# Patient Record
Sex: Male | Born: 1970
Health system: Southern US, Community
[De-identification: ages and names within clinical notes are randomized; demographics above are authoritative.]

## PROBLEM LIST (undated history)

## (undated) DIAGNOSIS — F319 Bipolar disorder, unspecified: Secondary | ICD-10-CM

## (undated) DIAGNOSIS — F32A Depression, unspecified: Secondary | ICD-10-CM

## (undated) DIAGNOSIS — F329 Major depressive disorder, single episode, unspecified: Secondary | ICD-10-CM

## (undated) DIAGNOSIS — M5430 Sciatica, unspecified side: Secondary | ICD-10-CM

## (undated) DIAGNOSIS — T7840XA Allergy, unspecified, initial encounter: Secondary | ICD-10-CM

## (undated) DIAGNOSIS — B009 Herpesviral infection, unspecified: Secondary | ICD-10-CM

## (undated) DIAGNOSIS — F419 Anxiety disorder, unspecified: Secondary | ICD-10-CM

## (undated) DIAGNOSIS — G8929 Other chronic pain: Secondary | ICD-10-CM

## (undated) DIAGNOSIS — R55 Syncope and collapse: Secondary | ICD-10-CM

## (undated) HISTORY — DX: Anxiety disorder, unspecified: F41.9

## (undated) HISTORY — DX: Major depressive disorder, single episode, unspecified: F32.9

## (undated) HISTORY — DX: Allergy, unspecified, initial encounter: T78.40XA

## (undated) HISTORY — PX: KNEE SURGERY: SHX244

## (undated) HISTORY — PX: HERNIA REPAIR: SHX51

## (undated) HISTORY — DX: Bipolar disorder, unspecified: F31.9

## (undated) HISTORY — DX: Depression, unspecified: F32.A

---

## 2006-06-28 ENCOUNTER — Emergency Department (HOSPITAL_COMMUNITY): Admission: EM | Admit: 2006-06-28 | Discharge: 2006-06-28 | Payer: Self-pay | Admitting: Emergency Medicine

## 2007-06-24 ENCOUNTER — Emergency Department (HOSPITAL_COMMUNITY): Admission: EM | Admit: 2007-06-24 | Discharge: 2007-06-24 | Payer: Self-pay | Admitting: Emergency Medicine

## 2008-02-04 ENCOUNTER — Emergency Department (HOSPITAL_COMMUNITY): Admission: EM | Admit: 2008-02-04 | Discharge: 2008-02-04 | Payer: Self-pay | Admitting: Emergency Medicine

## 2008-09-22 ENCOUNTER — Emergency Department (HOSPITAL_COMMUNITY): Admission: EM | Admit: 2008-09-22 | Discharge: 2008-09-22 | Payer: Self-pay | Admitting: Emergency Medicine

## 2010-07-20 ENCOUNTER — Emergency Department (HOSPITAL_COMMUNITY)
Admission: EM | Admit: 2010-07-20 | Discharge: 2010-07-20 | Disposition: A | Discharge status: Home / Self Care | Payer: Self-pay | Attending: Emergency Medicine | Admitting: Emergency Medicine

## 2010-07-20 ENCOUNTER — Emergency Department (HOSPITAL_COMMUNITY): Payer: Self-pay

## 2010-07-20 DIAGNOSIS — M545 Low back pain, unspecified: Secondary | ICD-10-CM | POA: Insufficient documentation

## 2010-07-20 DIAGNOSIS — M25579 Pain in unspecified ankle and joints of unspecified foot: Secondary | ICD-10-CM | POA: Insufficient documentation

## 2010-07-20 DIAGNOSIS — Y92009 Unspecified place in unspecified non-institutional (private) residence as the place of occurrence of the external cause: Secondary | ICD-10-CM | POA: Insufficient documentation

## 2010-07-20 DIAGNOSIS — W108XXA Fall (on) (from) other stairs and steps, initial encounter: Secondary | ICD-10-CM | POA: Insufficient documentation

## 2010-07-20 DIAGNOSIS — R110 Nausea: Secondary | ICD-10-CM | POA: Insufficient documentation

## 2010-07-20 DIAGNOSIS — M25519 Pain in unspecified shoulder: Secondary | ICD-10-CM | POA: Insufficient documentation

## 2010-07-20 DIAGNOSIS — M542 Cervicalgia: Secondary | ICD-10-CM | POA: Insufficient documentation

## 2010-07-20 DIAGNOSIS — K219 Gastro-esophageal reflux disease without esophagitis: Secondary | ICD-10-CM | POA: Insufficient documentation

## 2010-07-20 DIAGNOSIS — R51 Headache: Secondary | ICD-10-CM | POA: Insufficient documentation

## 2011-01-01 LAB — BASIC METABOLIC PANEL
Calcium: 9.6
GFR calc Af Amer: >60
GFR calc non Af Amer: 54 — ABNORMAL LOW
Sodium: 138

## 2011-01-01 LAB — CBC
Hemoglobin: 14.7
RBC: 4.53
WBC: 4.9

## 2011-01-01 LAB — URINALYSIS, ROUTINE W REFLEX MICROSCOPIC
Glucose, UA: NEGATIVE
Leukocytes, UA: NEGATIVE
Specific Gravity, Urine: >1.03 — ABNORMAL HIGH
Urobilinogen, UA: 0.2

## 2011-01-01 LAB — DIFFERENTIAL
Lymphocytes Relative: 30
Monocytes Absolute: 0.5
Monocytes Relative: 9
Neutro Abs: 2.7

## 2011-01-01 LAB — URINE MICROSCOPIC-ADD ON

## 2011-05-30 ENCOUNTER — Emergency Department (HOSPITAL_COMMUNITY): Payer: Self-pay

## 2011-05-30 ENCOUNTER — Encounter (HOSPITAL_COMMUNITY): Payer: Self-pay | Admitting: *Deleted

## 2011-05-30 ENCOUNTER — Emergency Department (HOSPITAL_COMMUNITY)
Admission: EM | Admit: 2011-05-30 | Discharge: 2011-05-30 | Disposition: A | Discharge status: Home / Self Care | Payer: Self-pay | Attending: Emergency Medicine | Admitting: Emergency Medicine

## 2011-05-30 DIAGNOSIS — S7010XA Contusion of unspecified thigh, initial encounter: Secondary | ICD-10-CM | POA: Insufficient documentation

## 2011-05-30 DIAGNOSIS — S39012A Strain of muscle, fascia and tendon of lower back, initial encounter: Secondary | ICD-10-CM

## 2011-05-30 DIAGNOSIS — M545 Low back pain, unspecified: Secondary | ICD-10-CM | POA: Insufficient documentation

## 2011-05-30 DIAGNOSIS — IMO0002 Reserved for concepts with insufficient information to code with codable children: Secondary | ICD-10-CM

## 2011-05-30 DIAGNOSIS — S335XXA Sprain of ligaments of lumbar spine, initial encounter: Secondary | ICD-10-CM | POA: Insufficient documentation

## 2011-05-30 DIAGNOSIS — W11XXXA Fall on and from ladder, initial encounter: Secondary | ICD-10-CM | POA: Insufficient documentation

## 2011-05-30 DIAGNOSIS — M25559 Pain in unspecified hip: Secondary | ICD-10-CM | POA: Insufficient documentation

## 2011-05-30 MED ORDER — NAPROXEN 500 MG PO TABS: 500.0000 mg | ORAL_TABLET | Freq: Two times a day (BID) | ORAL | Status: DC

## 2011-05-30 MED ORDER — HYDROCODONE-ACETAMINOPHEN 5-325 MG PO TABS: 1.0000 | ORAL_TABLET | Freq: Four times a day (QID) | ORAL | Status: DC | PRN

## 2011-05-30 MED ORDER — IBUPROFEN 800 MG PO TABS
800.0000 mg | ORAL_TABLET | Freq: Once | ORAL | Status: AC
  Administered 2011-05-30: 800 mg via ORAL
  Filled 2011-05-30: qty 1

## 2011-05-30 NOTE — ED Notes (Signed)
Pt states his ladder was beginning to fall so he jumped off. Pain to lower back, right hip and tingling to fingers  This mroning. Occurred yesterday.

## 2011-05-30 NOTE — ED Provider Notes (Signed)
History   This chart was scribed for Raymond Jakes, MD by Clarita Crane. The patient was seen in room APA12/APA12. Patient's care was started at [phone].    CSN: [id number]  Arrival date & time 05/30/11  5621   First MD Initiated Contact with Patient 05/30/11 [phone]      Chief Complaint  Patient presents with  . Back Pain  . Hip Pain    (Consider location/radiation/quality/duration/timing/severity/associated sxs/prior treatment) HPI Raymond Simpson is a 41 y.o. male who presents to the Emergency Department complaining of constant moderate to severe right hip pain and lower back pain onset yesterday after jumping from a height of 10 feet after a ladder he was standing on began to slip. States he landed on his RLE after jumping.  Notes pain is aggravated with movement. Denies head injury, LOC, HA, neck pain, chest pain, abdominal pain, SOB, numbness. Patient is a current smoker.   History reviewed. No pertinent past medical history.  Past Surgical History  Procedure Date  . Knee surgery   . Hernia repair     No family history on file.  History  Substance Use Topics  . Smoking status: Current Everyday Smoker  . Smokeless tobacco: Not on file  . Alcohol Use: Yes     Occ     Review of Systems  Constitutional: Negative for fever and chills.  HENT: Negative for rhinorrhea and neck pain.   Eyes: Negative for pain.  Respiratory: Negative for cough and shortness of breath.   Cardiovascular: Negative for chest pain.  Gastrointestinal: Negative for nausea, vomiting, abdominal pain and diarrhea.  Genitourinary: Negative for dysuria.  Musculoskeletal: Positive for back pain.       Right Hip Pain  Skin: Negative for rash.  Neurological: Negative for dizziness and weakness.    Allergies  Review of patient's allergies indicates no known allergies.  Home Medications   Current Outpatient Rx  Name Route Sig Dispense Refill  . HYDROCODONE-ACETAMINOPHEN 5-325 MG PO TABS Oral  Take 1-2 tablets by mouth every 6 (six) hours as needed for pain. 10 tablet 0  . NAPROXEN 500 MG PO TABS Oral Take 1 tablet (500 mg total) by mouth 2 (two) times daily. 14 tablet 0    BP 146/93  Pulse 88  Temp(Src) 98.1 F (36.7 C) (Oral)  Resp 16  Ht 5\' 6"  (1.676 m)  Wt 160 lb (72.576 kg)  BMI 25.82 kg/m2  SpO2 99%  Physical Exam  Nursing note and vitals reviewed. Constitutional: He is oriented to person, place, and time. He appears well-developed and well-nourished. No distress.  HENT:  Head: Normocephalic and atraumatic.  Eyes: EOM are normal. Pupils are equal, round, and reactive to light.  Neck: Normal range of motion. Neck supple. No tracheal deviation present.  Cardiovascular: Normal rate and regular rhythm.   No murmur heard.      DP pulses 2+ bilaterally.   Pulmonary/Chest: Effort normal. No respiratory distress. He has no wheezes. He has no rales.  Abdominal: Soft. He exhibits no distension.  Musculoskeletal: Normal range of motion. He exhibits no edema and no tenderness.       Right hip non-tender. C-spine non-tender.   Neurological: He is alert and oriented to person, place, and time. No cranial nerve deficit or sensory deficit.       Distal neurovascular intact.   Skin: Skin is warm and dry.  Psychiatric: He has a normal mood and affect. His behavior is normal.    ED Course  Procedures (including critical care time)  DIAGNOSTIC STUDIES: Oxygen Saturation is 99% on room air, normal by my interpretation.    COORDINATION OF CARE: 7:36AM- Patient informed of current plan for treatment and evaluation and agrees with plan at this time.  8:47AM- Patient informed of current imaging results and intent to d/c home. Patient agrees with plan set forth at this time.    Labs Reviewed - No data to display Dg Lumbar Spine Complete  05/30/2011  *RADIOLOGY REPORT*  Clinical Data: 41 year old male status post fall with pain.  LUMBAR SPINE - COMPLETE 4+ VIEW  Comparison:  07/20/2010.  Findings: Normal lumbar segmentation. Bone mineralization is within normal limits.  Stable and normal lumbar vertebral height and alignment.  No pars fracture.  Sacrum and SI joints within normal limits.  Grossly intact visualized lower thoracic levels.  IMPRESSION: No acute fracture or listhesis identified in the lumbar spine.  Original Report Authenticated By: Harley Hallmark, M.D.   Dg Hip Complete Right  05/30/2011  *RADIOLOGY REPORT*  Clinical Data: 41 year old male status post fall with pain.  RIGHT HIP - COMPLETE 2+ VIEW  Comparison: CT pelvis 06/24/2007.  Findings: Femoral heads are normally located.  Joint spaces are preserved. Bone mineralization is within normal limits.  Pelvis appears intact.  SI joints within normal limits.  Grossly intact proximal left femur. AP and frog-leg lateral views of the right hip demonstrate a stable ossific fragment of the anterior lip of the acetabulum. Proximal right femur is intact.  IMPRESSION: No acute fracture or dislocation identified about the right hip or pelvis.  Original Report Authenticated By: Harley Hallmark, M.D.     1. Lumbar strain   2. Contusion of hip or thigh, right       MDM  X-rays of low back and hip without any evidence of bony fracture probably sustained a muscular contusion or injury from the fall. Will treat with anti-inflammatory Naprosyn and hydrocodone as needed for more severe pain. Patient will followup if not improving in 4-5 days     I personally performed the services described in this documentation, which was scribed in my presence. The recorded information has been reviewed and considered.     Raymond Jakes, MD 05/30/11 0900

## 2011-05-30 NOTE — ED Notes (Signed)
Pt reports jumped from a ladder approx 12 feet in the air yesterday.  C/O r hip pain and lower back pain.  Denies any other injury.  Pt ambulatory with limp.

## 2011-05-30 NOTE — Discharge Instructions (Signed)
X-rays of low back and right hip negative for fracture. Take anti-inflammatory Naprosyn as directed for the next 7 days and supplement with hydrocodone as needed for additional pain control. Followup with emergency department or use resource guide to find a primary care provider if not improving in 4-5 days.  RESOURCE GUIDE  Dental Problems  Patients with Medicaid: [facility] [phone] [address]                                           [phone] [address] Phone:  [phone]                                                  Phone:  [phone]  If unable to pay or uninsured, contact:  [facility] or [facility]. to become qualified for the adult dental clinic.  Chronic Pain Problems Contact [facility]  [phone] Patients need to be referred by their primary care doctor.  Insufficient Money for Medicine Contact [employer]:  call "211" or [facility] [phone])287-5734.  No Primary Care Doctor Call [facility]  972-115-2099 Other agencies that provide inexpensive medical care    [facility]  ([phone]    [facility]  [phone]    [facility]  [phone]    [facility]  [phone]    [facility]  651-800-5894    [facility]  [phone]  Psychological Services [facility]  [phone] [facility] Services  [phone] [facility]   [phone] (emergency services ([phone])  Substance Abuse Resources [facility]  9375754686 [facility] [phone] [facility] [place] 3644754033 Floydene Flock [phone] Residential & Outpatient Substance Abuse Program  9708460440  Abuse/Neglect [facility] Child Abuse Hotline 786-201-4628 [facility] Child Abuse Hotline 912-876-5778 (After Hours)  Emergency Shelter [facility] (301)682-1270  Maternity Homes [facility] the [place]  [facility] [place] (843)691-1734 [facility] 5677387013  MRSA Hotline #:   612-121-1193    [facility] Resources  [facility] [place]     [employer]                          [facility] Dept. [address] [place]                       [address]      [address]  [place]                                                Cristobal Goldmann Phone:  [phone]  Phone:  [phone]                 Phone:  [phone]  [facility] Phone:  [phone]  [place] Child Abuse Hotline (336) 342-1394 (336) [phone] (After Hours)   

## 2011-11-05 ENCOUNTER — Encounter (HOSPITAL_COMMUNITY): Payer: Self-pay | Admitting: *Deleted

## 2011-11-05 ENCOUNTER — Emergency Department (HOSPITAL_COMMUNITY): Payer: No Typology Code available for payment source

## 2011-11-05 ENCOUNTER — Emergency Department (HOSPITAL_COMMUNITY)
Admission: EM | Admit: 2011-11-05 | Discharge: 2011-11-05 | Disposition: A | Discharge status: Home / Self Care | Payer: No Typology Code available for payment source | Attending: Emergency Medicine | Admitting: Emergency Medicine

## 2011-11-05 DIAGNOSIS — Y92009 Unspecified place in unspecified non-institutional (private) residence as the place of occurrence of the external cause: Secondary | ICD-10-CM | POA: Insufficient documentation

## 2011-11-05 DIAGNOSIS — S76219A Strain of adductor muscle, fascia and tendon of unspecified thigh, initial encounter: Secondary | ICD-10-CM

## 2011-11-05 DIAGNOSIS — W010XXA Fall on same level from slipping, tripping and stumbling without subsequent striking against object, initial encounter: Secondary | ICD-10-CM | POA: Insufficient documentation

## 2011-11-05 DIAGNOSIS — IMO0002 Reserved for concepts with insufficient information to code with codable children: Secondary | ICD-10-CM | POA: Insufficient documentation

## 2011-11-05 DIAGNOSIS — F17200 Nicotine dependence, unspecified, uncomplicated: Secondary | ICD-10-CM | POA: Insufficient documentation

## 2011-11-05 MED ORDER — OXYCODONE-ACETAMINOPHEN 5-325 MG PO TABS
1.0000 | ORAL_TABLET | Freq: Once | ORAL | Status: AC
  Administered 2011-11-05: 1 via ORAL
  Filled 2011-11-05: qty 1

## 2011-11-05 MED ORDER — KETOROLAC TROMETHAMINE 60 MG/2ML IM SOLN
60.0000 mg | Freq: Once | INTRAMUSCULAR | Status: AC
  Administered 2011-11-05: 60 mg via INTRAMUSCULAR
  Filled 2011-11-05: qty 2

## 2011-11-05 MED ORDER — OXYCODONE-ACETAMINOPHEN 5-325 MG PO TABS
1.0000 | ORAL_TABLET | Freq: Once | ORAL | Status: AC
  Administered 2011-11-05: 1 via ORAL
  Filled 2011-11-05 (×2): qty 1

## 2011-11-05 MED ORDER — IBUPROFEN 800 MG PO TABS: 800.0000 mg | ORAL_TABLET | Freq: Three times a day (TID) | ORAL | Status: DC

## 2011-11-05 MED ORDER — IBUPROFEN 800 MG PO TABS: 800.0000 mg | ORAL_TABLET | Freq: Three times a day (TID) | ORAL | Status: AC

## 2011-11-05 MED ORDER — HYDROCODONE-ACETAMINOPHEN 5-325 MG PO TABS: 1.0000 | ORAL_TABLET | ORAL | Status: DC | PRN

## 2011-11-05 MED ORDER — OXYCODONE-ACETAMINOPHEN 5-325 MG PO TABS: 1.0000 | ORAL_TABLET | ORAL | Status: AC | PRN

## 2011-11-05 NOTE — ED Notes (Signed)
Pt slipped in water while at [facility] and did a "split". Pain now to right inner thigh.

## 2011-11-05 NOTE — ED Notes (Signed)
Pt stable and ambulatory at discharge Pt instructed not to drive while on pain medication also instructed not to take any extra tylenol with pain medication

## 2011-11-09 NOTE — ED Provider Notes (Signed)
Medical screening examination/treatment/procedure(s) were performed by non-physician practitioner and as supervising physician I was immediately available for consultation/collaboration.   Dione Booze, MD 11/09/11 [phone]

## 2011-11-09 NOTE — ED Provider Notes (Signed)
History     CSN: [id number]  Arrival date & time 11/05/11  1640   First MD Initiated Contact with Patient 11/05/11 1701      Chief Complaint  Patient presents with  . Leg Pain    (Consider location/radiation/quality/duration/timing/severity/associated sxs/prior treatment) HPI Comments: Raymond Simpson presents for evaluation of bilateral upper medial thigh pain after slipping on wet floor in a bathroom 5 hours ago,  Causing him to hyperextend hips into a "near splits" movement.  He applied an ice pack which has given some relief,  But he continues to have pain in his bilateral upper medial thighs,  Right side is worse than the left.  He denies any pain or injury to genitals. Pain is constant,  Aching,  But worse with attempts bring his right leg towards midline.  He has no other significant medical history.    The history is provided by the patient.    History reviewed. No pertinent past medical history.  Past Surgical History  Procedure Date  . Knee surgery   . Hernia repair     No family history on file.  History  Substance Use Topics  . Smoking status: Current Everyday Smoker  . Smokeless tobacco: Not on file  . Alcohol Use: Yes     Occ      Review of Systems  Musculoskeletal: Positive for myalgias and arthralgias. Negative for joint swelling.  Skin: Negative for wound.  Neurological: Negative for weakness and numbness.    Allergies  Review of patient's allergies indicates no known allergies.  Home Medications   Current Outpatient Rx  Name Route Sig Dispense Refill  . IBUPROFEN 800 MG PO TABS Oral Take 1 tablet (800 mg total) by mouth 3 (three) times daily. 21 tablet 0  . OXYCODONE-ACETAMINOPHEN 5-325 MG PO TABS Oral Take 1 tablet by mouth every 4 (four) hours as needed for pain. 30 tablet 0    BP 152/94  Pulse 77  Temp 98.3 F (36.8 C) (Oral)  Resp 20  Ht 5\' 6"  (1.676 m)  Wt 160 lb (72.576 kg)  BMI 25.82 kg/m2  SpO2 100%  Physical Exam    Constitutional: He appears well-developed and well-nourished.  HENT:  Head: Atraumatic.  Neck: Normal range of motion.  Cardiovascular:       Pulses equal bilaterally  Musculoskeletal: He exhibits tenderness.       Right upper leg: He exhibits tenderness. He exhibits no swelling, no edema and no deformity.       Legs:      No ecchymosis appreciated.  Neurological: He is alert. He has normal strength. He displays normal reflexes. No sensory deficit.       Equal strength  Skin: Skin is warm and dry.  Psychiatric: He has a normal mood and affect.    ED Course  Procedures (including critical care time)  Labs Reviewed - No data to display No results found.   1. Groin strain       MDM  xrays reviewed.  Prescribed ibuprofen and oxycodone.  Encouraged rest,  Ice packs.  Recheck if not improving over the next 7-10 days.          Burgess Amor, [place] 11/09/11 2204

## 2011-11-25 ENCOUNTER — Encounter (HOSPITAL_COMMUNITY): Payer: Self-pay | Admitting: *Deleted

## 2011-11-25 ENCOUNTER — Emergency Department (HOSPITAL_COMMUNITY)
Admission: EM | Admit: 2011-11-25 | Discharge: 2011-11-25 | Disposition: A | Discharge status: Home / Self Care | Payer: No Typology Code available for payment source | Attending: Emergency Medicine | Admitting: Emergency Medicine

## 2011-11-25 DIAGNOSIS — IMO0002 Reserved for concepts with insufficient information to code with codable children: Secondary | ICD-10-CM | POA: Insufficient documentation

## 2011-11-25 DIAGNOSIS — X500XXA Overexertion from strenuous movement or load, initial encounter: Secondary | ICD-10-CM | POA: Insufficient documentation

## 2011-11-25 DIAGNOSIS — S76219A Strain of adductor muscle, fascia and tendon of unspecified thigh, initial encounter: Secondary | ICD-10-CM

## 2011-11-25 DIAGNOSIS — F17200 Nicotine dependence, unspecified, uncomplicated: Secondary | ICD-10-CM | POA: Insufficient documentation

## 2011-11-25 MED ORDER — OXYCODONE-ACETAMINOPHEN 5-325 MG PO TABS: 1.0000 | ORAL_TABLET | ORAL | Status: DC | PRN

## 2011-11-25 NOTE — ED Notes (Addendum)
Pt c/o soreness in his groin since doing a split on 7/29. Pt states that he is still sore when he gets up in the morning but pain is worse today. Pt was seen on 11/05/11 for initial injury. Unable to follow up due to no insurance. Also c/o pain when he has an erection. Denies difficulty urinating.

## 2011-11-25 NOTE — ED Provider Notes (Signed)
History  This chart was scribed for Raymond Cooper III, MD by Ladona Ridgel Day. This patient was seen in room APA07/APA07 and the patient's care was started at 1541.   CSN: [id number]  Arrival date & time 11/25/11  1541   First MD Initiated Contact with Patient 11/25/11 1604      Chief Complaint  Patient presents with  . Groin Pain   Patient is a 41 y.o. male presenting with groin pain. The history is provided by the patient. No language interpreter was used.  Groin Pain This is a new problem. The current episode started more than 1 week ago. The problem occurs constantly. The problem has not changed since onset.Pertinent negatives include no chest pain, no abdominal pain and no shortness of breath. The symptoms are aggravated by walking. The symptoms are relieved by position. Treatments tried: Ibuprofen and percocet.  The treatment provided mild relief.   KAHLIN Simpson is a 41 y.o. male who presents to the Emergency Department complaining of constant groin pain after he injured it two weeks ago from doing a split. He states that he was evaluated here when it happened but his pain is not improving and worse when he has an erection. He tried percocet and Ibuprofen which has mildly improved his pain symptoms. He denies any cough, ear ache, cough, fever, CP, sore throat, emesis, diarrhea, urinary symptoms, and hematuria. He has no allergies. He denies any other injuries/illnesses at this time. He is a smoker and occasionally drinks alcohol.   History reviewed. No pertinent past medical history.  Past Surgical History  Procedure Date  . Knee surgery   . Hernia repair     History reviewed. No pertinent family history.  History  Substance Use Topics  . Smoking status: Current Everyday Smoker  . Smokeless tobacco: Not on file  . Alcohol Use: Yes     Occ      Review of Systems  Constitutional: Negative for fever and chills.  HENT: Negative for congestion and sore throat.   Respiratory:  Negative for cough and shortness of breath.   Cardiovascular: Negative for chest pain.  Gastrointestinal: Negative for nausea, vomiting, abdominal pain and diarrhea.  Genitourinary: Positive for penile pain. Negative for hematuria and difficulty urinating.  Skin: Negative for color change.  Neurological: Negative for weakness.  All other systems reviewed and are negative.    Allergies  Review of patient's allergies indicates no known allergies.  Home Medications   Current Outpatient Rx  Name Route Sig Dispense Refill  . OXYCODONE-ACETAMINOPHEN 5-325 MG PO TABS Oral Take 1 tablet by mouth every 4 (four) hours as needed for pain. 20 tablet 0    Triage Vitals: BP 124/93  Pulse 92  Temp 98.9 F (37.2 C) (Oral)  Resp 16  SpO2 96%  Physical Exam  Nursing note and vitals reviewed. Constitutional: He is oriented to person, place, and time. He appears well-developed and well-nourished. No distress.  HENT:  Head: Normocephalic and atraumatic.  Right Ear: External ear normal.  Left Ear: External ear normal.  Mouth/Throat: Oropharynx is clear and moist.  Eyes: Conjunctivae and EOM are normal.  Neck: Neck supple. No tracheal deviation present.  Cardiovascular: Normal rate, regular rhythm and normal heart sounds.   Pulmonary/Chest: Effort normal and breath sounds normal. No respiratory distress. He has no wheezes. He has no rales.  Abdominal: Soft. He exhibits no distension. There is no tenderness. There is no rebound.  Genitourinary: Penis normal. No penile tenderness.  Musculoskeletal: Normal  range of motion.       Tenderness to palpation of his right adductor tendons originating from his pubic symphysis and without any loss of continuity or deformity in his tendons.    Neurological: He is alert and oriented to person, place, and time.  Skin: Skin is warm and dry.  Psychiatric: He has a normal mood and affect. His behavior is normal.    ED Course  Procedures (including critical  care time) DIAGNOSTIC STUDIES: Oxygen Saturation is 96% on room air, adequate by my interpretation.    COORDINATION OF CARE: At 430 PM Discussed treatment plan with patient which includes pain medicine. Patient agrees.   Labs Reviewed - No data to display No results found.   1. Groin strain     I personally performed the services described in this documentation, which was scribed in my presence. The recorded information has been reviewed and considered.  Osvaldo Human, MD     Raymond Cooper III, MD 11/25/11 [phone]

## 2011-12-02 ENCOUNTER — Encounter (HOSPITAL_COMMUNITY): Payer: Self-pay

## 2011-12-02 ENCOUNTER — Emergency Department (HOSPITAL_COMMUNITY)
Admission: EM | Admit: 2011-12-02 | Discharge: 2011-12-02 | Disposition: A | Discharge status: Home / Self Care | Payer: No Typology Code available for payment source | Attending: Emergency Medicine | Admitting: Emergency Medicine

## 2011-12-02 ENCOUNTER — Emergency Department (HOSPITAL_COMMUNITY): Payer: No Typology Code available for payment source

## 2011-12-02 DIAGNOSIS — W19XXXA Unspecified fall, initial encounter: Secondary | ICD-10-CM | POA: Insufficient documentation

## 2011-12-02 DIAGNOSIS — S76219A Strain of adductor muscle, fascia and tendon of unspecified thigh, initial encounter: Secondary | ICD-10-CM

## 2011-12-02 DIAGNOSIS — IMO0002 Reserved for concepts with insufficient information to code with codable children: Secondary | ICD-10-CM | POA: Insufficient documentation

## 2011-12-02 DIAGNOSIS — F17200 Nicotine dependence, unspecified, uncomplicated: Secondary | ICD-10-CM | POA: Insufficient documentation

## 2011-12-02 LAB — BASIC METABOLIC PANEL
CO2: 32 mEq/L (ref 19–32)
Chloride: 98 mEq/L (ref 96–112)
Creatinine, Ser: 1.36 mg/dL — ABNORMAL HIGH (ref 0.50–1.35)

## 2011-12-02 LAB — URINALYSIS, ROUTINE W REFLEX MICROSCOPIC
Glucose, UA: NEGATIVE mg/dL
Ketones, ur: NEGATIVE mg/dL
Leukocytes, UA: NEGATIVE
pH: 6 (ref 5.0–8.0)

## 2011-12-02 LAB — URINE MICROSCOPIC-ADD ON

## 2011-12-02 MED ORDER — MORPHINE SULFATE 4 MG/ML IJ SOLN
4.0000 mg | Freq: Once | INTRAMUSCULAR | Status: AC
  Administered 2011-12-02: 4 mg via INTRAVENOUS
  Filled 2011-12-02: qty 1

## 2011-12-02 MED ORDER — IOHEXOL 300 MG/ML  SOLN
100.0000 mL | Freq: Once | INTRAMUSCULAR | Status: AC | PRN
  Administered 2011-12-02: 100 mL via INTRAVENOUS

## 2011-12-02 MED ORDER — METHOCARBAMOL 500 MG PO TABS: ORAL_TABLET | ORAL | Status: DC

## 2011-12-02 MED ORDER — ONDANSETRON 4 MG PO TBDP
4.0000 mg | ORAL_TABLET | Freq: Once | ORAL | Status: AC
  Administered 2011-12-02: 4 mg via ORAL
  Filled 2011-12-02: qty 1

## 2011-12-02 MED ORDER — HYDROCODONE-ACETAMINOPHEN 5-325 MG PO TABS: ORAL_TABLET | ORAL | Status: DC

## 2011-12-02 MED ORDER — SODIUM CHLORIDE 0.9 % IV SOLN
1000.0000 mL | Freq: Once | INTRAVENOUS | Status: AC
  Administered 2011-12-02: 1000 mL via INTRAVENOUS

## 2011-12-02 MED ORDER — SODIUM CHLORIDE 0.9 % IV SOLN: 1000.0000 mL | INTRAVENOUS | Status: DC

## 2011-12-02 NOTE — ED Provider Notes (Signed)
History     CSN: [id number]  Arrival date & time 12/02/11  1635   First MD Initiated Contact with Patient 12/02/11      Chief Complaint  Patient presents with  . Groin Injury    (Consider location/radiation/quality/duration/timing/severity/associated sxs/prior treatment) HPI Comments: Patient is a 41 year old male who reports an injury to the groin area approximately one month ago as he was forced into a splint position while falling. It is of note that the patient has had multiple hernia surgeries particularly on the right side. The patient states since his slip and fall approximately one month ago he has had continued pain in the right groin and inguinal area. He states that this morning he thought he noticed a change in his urine. And the pain seemed to be progressively worse in his groin and inguinal area. He has not had any injury to the penis. He's not had any additional injury to the groin or inguinal area. There's been no fever or chills reported. Patient has tried over-the-counter Tylenol and ibuprofen without any improvement in his pain. Patient presents at this time for additional evaluation for this.  The history is provided by the patient.    History reviewed. No pertinent past medical history.  Past Surgical History  Procedure Date  . Knee surgery   . Hernia repair     No family history on file.  History  Substance Use Topics  . Smoking status: Current Everyday Smoker    Types: Cigarettes  . Smokeless tobacco: Not on file  . Alcohol Use: Yes     Occ      Review of Systems  Constitutional: Negative for activity change.       All ROS Neg except as noted in HPI  HENT: Negative for nosebleeds and neck pain.   Eyes: Negative for photophobia and discharge.  Respiratory: Negative for cough, shortness of breath and wheezing.   Cardiovascular: Negative for chest pain and palpitations.  Gastrointestinal: Negative for abdominal pain and blood in stool.    Genitourinary: Negative for dysuria, frequency and hematuria.       Inguinal pain.  Musculoskeletal: Positive for arthralgias. Negative for back pain.  Skin: Negative.   Neurological: Negative for dizziness, seizures and speech difficulty.  Psychiatric/Behavioral: Negative for hallucinations and confusion.    Allergies  Review of patient's allergies indicates no known allergies.  Home Medications  No current outpatient prescriptions on file.  BP 138/82  Pulse 84  Temp 98.6 F (37 C) (Oral)  Resp 18  SpO2 100%  Physical Exam  Nursing note and vitals reviewed. Constitutional: He is oriented to person, place, and time. He appears well-developed and well-nourished.  Non-toxic appearance.  HENT:  Head: Normocephalic.  Right Ear: Tympanic membrane and external ear normal.  Left Ear: Tympanic membrane and external ear normal.  Eyes: EOM and lids are normal. Pupils are equal, round, and reactive to light.  Neck: Normal range of motion. Neck supple. Carotid bruit is not present.  Cardiovascular: Normal rate, regular rhythm, normal heart sounds, intact distal pulses and normal pulses.   Pulmonary/Chest: Breath sounds normal. No respiratory distress.  Abdominal: Soft. Bowel sounds are normal. There is no tenderness. There is no guarding.  Genitourinary:       There is a well-healed surgical scars of the right inguinal area. When tested for hernia the inguinal wall is somewhat weak but no definite herniation appreciated at this time. The inguinal area is not hot to touch. No penile abnormality appreciated.  Musculoskeletal: Normal range of motion.       There is pain and tenseness of the right adductor  tendon from the mid right thigh into the inguinal area. There is no deformity of the tendon. There is no hematoma appreciated. The area is not hot.  Lymphadenopathy:       Head (right side): No submandibular adenopathy present.       Head (left side): No submandibular adenopathy present.     He has no cervical adenopathy.  Neurological: He is alert and oriented to person, place, and time. He has normal strength. No cranial nerve deficit or sensory deficit.  Skin: Skin is warm and dry.  Psychiatric: He has a normal mood and affect. His speech is normal.    ED Course  Procedures (including critical care time)  Labs Reviewed  URINALYSIS, ROUTINE W REFLEX MICROSCOPIC - Abnormal; Notable for the following:    Specific Gravity, Urine >1.030 (*)     Hgb urine dipstick TRACE (*)     All other components within normal limits  URINE MICROSCOPIC-ADD ON  BASIC METABOLIC PANEL   No results found.   No diagnosis found.    MDM  I have reviewed nursing notes, vital signs, and all appropriate lab and imaging results for this patient. Patient has a history of multiple hernia repairs particularly on the right inguinal area. He had a groin strain on the right approximately a month ago during a fall. The patient states he's been having pain since that time but has not been evaluated by the orthopedist. The pain was worse today, and the patient thought that it was a change in his urine. Basic metabolic reveals slightly elevated creatinine of 1.36, glomerular filtration rate 4 African American is low at 73. Urinalysis reveals some increase in the urine specific gravity of 1.030 otherwise noncontributory. CT scan of the abdomen and pelvis with contrast is negative for any acute changes.  It is felt that the patient's pain is related to an inguinal strain. 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 The patient is to use with Robaxin 3 times daily, and Norco every 4 hours as needed for pain #20 tablets. Patient strongly encouraged to see the orthopedic specialist for formal evaluation.    Kathie Dike, [place] 12/02/11 2003

## 2011-12-02 NOTE — ED Notes (Signed)
Patient finished with oral contrast, CT made aware. Patient requesting something for pain, EDPA made aware-orders entered.

## 2011-12-02 NOTE — ED Provider Notes (Signed)
Medical screening examination/treatment/procedure(s) were conducted as a shared visit with non-physician practitioner(s) and myself.  I personally evaluated the patient during the encounter  Patient seen by me, concern for persistent right groin pain, and has had hernia repairs in past, CT negative, most likely groin strian, can be treated as such.  Shelda Jakes, MD 12/02/11 2014

## 2011-12-02 NOTE — ED Notes (Signed)
Slipped on water 1 month ago, area cont. To hurt.

## 2012-04-24 ENCOUNTER — Encounter (HOSPITAL_COMMUNITY): Payer: Self-pay | Admitting: *Deleted

## 2012-04-24 ENCOUNTER — Emergency Department (HOSPITAL_COMMUNITY)
Admission: EM | Admit: 2012-04-24 | Discharge: 2012-04-24 | Disposition: A | Discharge status: Home / Self Care | Payer: Self-pay | Attending: Emergency Medicine | Admitting: Emergency Medicine

## 2012-04-24 DIAGNOSIS — H5710 Ocular pain, unspecified eye: Secondary | ICD-10-CM | POA: Insufficient documentation

## 2012-04-24 DIAGNOSIS — R110 Nausea: Secondary | ICD-10-CM | POA: Insufficient documentation

## 2012-04-24 DIAGNOSIS — R51 Headache: Secondary | ICD-10-CM | POA: Insufficient documentation

## 2012-04-24 DIAGNOSIS — F17200 Nicotine dependence, unspecified, uncomplicated: Secondary | ICD-10-CM | POA: Insufficient documentation

## 2012-04-24 DIAGNOSIS — Z8619 Personal history of other infectious and parasitic diseases: Secondary | ICD-10-CM | POA: Insufficient documentation

## 2012-04-24 DIAGNOSIS — H53149 Visual discomfort, unspecified: Secondary | ICD-10-CM | POA: Insufficient documentation

## 2012-04-24 DIAGNOSIS — R6889 Other general symptoms and signs: Secondary | ICD-10-CM | POA: Insufficient documentation

## 2012-04-24 DIAGNOSIS — J3489 Other specified disorders of nose and nasal sinuses: Secondary | ICD-10-CM | POA: Insufficient documentation

## 2012-04-24 HISTORY — DX: Herpesviral infection, unspecified: B00.9

## 2012-04-24 MED ORDER — FLUORESCEIN SODIUM 1 MG OP STRP
ORAL_STRIP | OPHTHALMIC | Status: AC
  Administered 2012-04-24: 1
  Filled 2012-04-24: qty 1

## 2012-04-24 MED ORDER — PSEUDOEPHEDRINE HCL 60 MG PO TABS
60.0000 mg | ORAL_TABLET | Freq: Once | ORAL | Status: AC
  Administered 2012-04-24: 60 mg via ORAL
  Filled 2012-04-24: qty 1

## 2012-04-24 MED ORDER — HYDROCODONE-ACETAMINOPHEN 5-325 MG PO TABS
2.0000 | ORAL_TABLET | Freq: Once | ORAL | Status: AC
  Administered 2012-04-24: 2 via ORAL
  Filled 2012-04-24: qty 2

## 2012-04-24 MED ORDER — HYDROCODONE-ACETAMINOPHEN 7.5-325 MG PO TABS: 1.0000 | ORAL_TABLET | ORAL | Status: AC | PRN

## 2012-04-24 MED ORDER — PREDNISONE 10 MG PO TABS: ORAL_TABLET | ORAL | Status: DC

## 2012-04-24 MED ORDER — KETOROLAC TROMETHAMINE 10 MG PO TABS
10.0000 mg | ORAL_TABLET | Freq: Once | ORAL | Status: AC
  Administered 2012-04-24: 10 mg via ORAL
  Filled 2012-04-24: qty 1

## 2012-04-24 MED ORDER — TETRACAINE HCL 0.5 % OP SOLN
OPHTHALMIC | Status: AC
  Administered 2012-04-24: 19:00:00
  Filled 2012-04-24: qty 2

## 2012-04-24 MED ORDER — PREDNISONE 50 MG PO TABS
60.0000 mg | ORAL_TABLET | Freq: Once | ORAL | Status: AC
  Administered 2012-04-24: 60 mg via ORAL
  Filled 2012-04-24: qty 1

## 2012-04-24 MED ORDER — ACYCLOVIR 800 MG PO TABS: 400.0000 mg | ORAL_TABLET | Freq: Every day | ORAL | Status: DC

## 2012-04-24 MED ORDER — ONDANSETRON 4 MG PO TBDP
4.0000 mg | ORAL_TABLET | Freq: Once | ORAL | Status: AC
  Administered 2012-04-24: 4 mg via ORAL
  Filled 2012-04-24: qty 1

## 2012-04-24 NOTE — ED Provider Notes (Signed)
History     CSN: [id number]  Arrival date & time 04/24/12  1825   First MD Initiated Contact with Patient 04/24/12 1840      Chief Complaint  Patient presents with  . Eye Problem    (Consider location/radiation/quality/duration/timing/severity/associated sxs/prior treatment) HPI Comments: Patient states that approximately 2 years ago he had herpes in the orbit area around the right eye. He has not had an outbreak since that time. He states that today he started having pain around the eye and a sensation of pressure behind the eye. There's been no significant change in his vision, the patient has some nasal congestion with runny nose. He's not had high fever. The patient has not had any injury to the eye or any surgery to the eye the right. There's been no known exposure to pink eye. The patient has not been drilling or grinding or been near any drilling or grinding recently.  Patient is a 42 y.o. male presenting with eye problem. The history is provided by the patient.  Eye Problem  This is a new problem. The current episode started 3 to 5 hours ago. The problem occurs constantly. The problem has not changed since onset.The right eye is affected.There was no injury mechanism. The pain is severe. There is no history of trauma to the eye. There is no known exposure to pink eye. He does not wear contacts. Associated symptoms include photophobia, eye redness and nausea. Pertinent negatives include no numbness, no blurred vision, no decreased vision, no discharge, no double vision, no foreign body sensation and no itching. He has tried nothing for the symptoms.    Past Medical History  Diagnosis Date  . Herpes     right eye    Past Surgical History  Procedure Date  . Knee surgery   . Hernia repair     No family history on file.  History  Substance Use Topics  . Smoking status: Current Every Day Smoker    Types: Cigarettes  . Smokeless tobacco: Not on file  . Alcohol Use: Yes   Comment: Occ      Review of Systems  Constitutional: Negative for activity change.       All ROS Neg except as noted in HPI  HENT: Negative for nosebleeds and neck pain.   Eyes: Positive for photophobia and redness. Negative for blurred vision, double vision and discharge.  Respiratory: Negative for cough, shortness of breath and wheezing.   Cardiovascular: Negative for chest pain and palpitations.  Gastrointestinal: Positive for nausea. Negative for abdominal pain and blood in stool.  Genitourinary: Negative for dysuria, frequency and hematuria.  Musculoskeletal: Negative for back pain and arthralgias.  Skin: Negative.  Negative for itching.  Neurological: Negative for dizziness, seizures, speech difficulty and numbness.  Psychiatric/Behavioral: Negative for hallucinations and confusion.    Allergies  Review of patient's allergies indicates no known allergies.  Home Medications   Current Outpatient Rx  Name  Route  Sig  Dispense  Refill  . HYDROCODONE-ACETAMINOPHEN 5-325 MG PO TABS      1 or 2 po q4h prn pain   20 tablet   0   . METHOCARBAMOL 500 MG PO TABS      1 po tid   21 tablet   0     BP 137/96  Pulse 77  Temp 98.1 F (36.7 C)  Resp 20  Ht 5\' 6"  (1.676 m)  Wt 160 lb (72.576 kg)  BMI 25.82 kg/m2  SpO2 100%  Physical Exam  Nursing note and vitals reviewed. Constitutional: He is oriented to person, place, and time. He appears well-developed and well-nourished.  Non-toxic appearance.  HENT:  Head: Normocephalic.  Right Ear: Tympanic membrane and external ear normal.  Left Ear: Tympanic membrane and external ear normal.       Nasal congestion.  Eyes: EOM and lids are normal. Pupils are equal, round, and reactive to light.       There is soreness about the orbit of the right eye. No involvement of the left. There is no swelling of the lids of the right eye. There no lesions or foreign body noted under the lids of the right eye. The conjunctiva shows mild  to moderate redness. The extraocular movements are intact. The anterior chamber is clear on the right. The funduscopic examination is negative for hemorrhage or exudate. The disc is sharp and flat.  Fluoroscopy seen was applied to the eye and the eye was examined with slit-lamp. There no lesions noted of the cornea. There is no foreign body noted. Again the anterior chamber is clear.  Neck: Normal range of motion. Neck supple. Carotid bruit is not present.  Cardiovascular: Normal rate, regular rhythm, normal heart sounds, intact distal pulses and normal pulses.   Pulmonary/Chest: Breath sounds normal. No respiratory distress.  Abdominal: Soft. Bowel sounds are normal. There is no tenderness. There is no guarding.  Musculoskeletal: Normal range of motion.  Lymphadenopathy:       Head (right side): No submandibular adenopathy present.       Head (left side): No submandibular adenopathy present.    He has no cervical adenopathy.  Neurological: He is alert and oriented to person, place, and time. He has normal strength. No cranial nerve deficit or sensory deficit.  Skin: Skin is warm and dry.  Psychiatric: He has a normal mood and affect. His speech is normal.    ED Course  Procedures (including critical care time)  Labs Reviewed - No data to display No results found.  Pulse oximetry 100% on room air. Within normal limits by my interpretation. No diagnosis found.    MDM  I have reviewed nursing notes, vital signs, and all appropriate lab and imaging results for this patient. Patient states he has a history of herpes in and around the right eye 2 years ago. Today he noted a similar feeling around the right eye. He's not had any problem with his vision. He states though he has a tremendous headache. The slit-lamp examination is negative for herpetic lesions on the cornea. The vital signs are within normal range with exception of the blood pressure being 137/96. The patient's pressure was  measured by Tono-Pen by Dr. Judd Lien and found to be 13, 13, 15. Visual acuity noted.  The patient is treated with Norco every 4 hours, prednisone daily, and acyclovir 5 times daily. Patient is to see his primary physician or return to the emergency apartment if any changes, problems, or concerns.      Kathie Dike, [place] 04/24/12 2043

## 2012-04-24 NOTE — ED Notes (Signed)
Pt states that he was diagnosed with herpes in his right eye several years ago, has not had an outbreak in "awhile", started to have pain to right eye that radiates to right side of head about a hour ago, feels the same as with his previous herpes outbreak,

## 2012-04-25 NOTE — ED Provider Notes (Signed)
Medical screening examination/treatment/procedure(s) were conducted as a shared visit with non-physician practitioner(s) and myself.  I personally evaluated the patient during the encounter.  I saw the patient along with Ivery Quale and agree with his note and plan.  The patient presents with painful right eye.  He reports no injury or trauma but does report a history of herpetic keratitis in the past.  This feels the same.   On exam, the vitals are stable and the patient is afebrile.  He is in no distress.  The eye was stained with fluorescein and no abrasions or dendritic lesions were noted with woods lamp.  The tonopen was used to measure pressures and were 13 and 15.    Due to the patient's history of herpetic keratitis, we feel as though it is best to start acyclovir and steroids.  He was given the contact information for follow up with the local ophthalmologist.  To return to the ED prn.    Geoffery Lyons, MD 04/25/12 [phone]

## 2012-12-11 ENCOUNTER — Emergency Department (HOSPITAL_COMMUNITY)
Admission: EM | Admit: 2012-12-11 | Discharge: 2012-12-11 | Disposition: A | Discharge status: Home / Self Care | Payer: BC Managed Care – PPO | Attending: Emergency Medicine | Admitting: Emergency Medicine

## 2012-12-11 ENCOUNTER — Encounter (HOSPITAL_COMMUNITY): Payer: Self-pay | Admitting: *Deleted

## 2012-12-11 DIAGNOSIS — H109 Unspecified conjunctivitis: Secondary | ICD-10-CM

## 2012-12-11 DIAGNOSIS — R51 Headache: Secondary | ICD-10-CM | POA: Insufficient documentation

## 2012-12-11 DIAGNOSIS — Z8619 Personal history of other infectious and parasitic diseases: Secondary | ICD-10-CM | POA: Insufficient documentation

## 2012-12-11 DIAGNOSIS — Z79899 Other long term (current) drug therapy: Secondary | ICD-10-CM | POA: Insufficient documentation

## 2012-12-11 DIAGNOSIS — F17200 Nicotine dependence, unspecified, uncomplicated: Secondary | ICD-10-CM | POA: Insufficient documentation

## 2012-12-11 MED ORDER — ACYCLOVIR 400 MG PO TABS: 400.0000 mg | ORAL_TABLET | Freq: Every day | ORAL | Status: DC

## 2012-12-11 MED ORDER — TETRACAINE HCL 0.5 % OP SOLN
OPHTHALMIC | Status: AC
  Administered 2012-12-11: 19:00:00
  Filled 2012-12-11: qty 2

## 2012-12-11 MED ORDER — HYDROCODONE-ACETAMINOPHEN 5-325 MG PO TABS: 2.0000 | ORAL_TABLET | ORAL | Status: DC | PRN

## 2012-12-11 MED ORDER — FLUORESCEIN SODIUM 1 MG OP STRP
ORAL_STRIP | OPHTHALMIC | Status: AC
  Administered 2012-12-11: 19:00:00
  Filled 2012-12-11: qty 1

## 2012-12-11 NOTE — ED Notes (Signed)
Pain rt eye and headache, Hx of herpes of eye

## 2012-12-11 NOTE — ED Provider Notes (Signed)
CSN: [id number]     Arrival date & time 12/11/12  1817 History  This chart was scribed for Geoffery Lyons, MD by Clydene Laming, ED Scribe and Bennett Scrape, ED Scribe. This patient was seen in room APFT21/APFT21 and the patient's care was started at 6:35PM.     Chief Complaint  Patient presents with  . Eye Pain    The history is provided by the patient. No language interpreter was used.    HPI Comments: Raymond Simpson is a 42 y.o. male who presents to the Emergency Department complaining of right eye pain described as pressure like that began today associated with a headache. Pt believes symptoms are similar to his last episode of herpes of the eye that occurred five years ago. Pain is worsened with light.He states he felt dirt was in the eye but he denies any foreign body. Previously treated with eyedrops for last episode. Pt denies use of contacts. Pt denies loss of vision or visual disturbance. Pt denies any allergies.  Pt has opthalmologist he can follow up with.  Past Medical History  Diagnosis Date  . Herpes     right eye   Past Surgical History  Procedure Laterality Date  . Knee surgery    . Hernia repair     History reviewed. No pertinent family history. History  Substance Use Topics  . Smoking status: Current Every Day Smoker    Types: Cigarettes  . Smokeless tobacco: Not on file  . Alcohol Use: Yes     Comment: Occ    Review of Systems  Eyes: Positive for photophobia and pain. Negative for visual disturbance.  Neurological: Positive for headaches.    Allergies  Review of patient's allergies indicates no known allergies.  Home Medications   Current Outpatient Rx  Name  Route  Sig  Dispense  Refill  . acyclovir (ZOVIRAX) 800 MG tablet   Oral   Take 0.5 tablets (400 mg total) by mouth 5 (five) times daily.   35 tablet   0   . Multiple Vitamin (MULTIVITAMIN WITH MINERALS) TABS   Oral   Take 1 tablet by mouth daily. MEGA MENS MULTIVITAMIN         .  predniSONE (DELTASONE) 10 MG tablet      6,5,4,3,2,1 - take with food   21 tablet   0    Triage Vitals: BP 128/88  Pulse 88  Temp(Src) 98.7 F (37.1 C) (Oral)  Resp 18  Ht 5\' 7"  (1.702 m)  Wt 155 lb (70.308 kg)  BMI 24.27 kg/m2  SpO2 99%  Physical Exam  Nursing note and vitals reviewed. Constitutional: He is oriented to person, place, and time. He appears well-developed and well-nourished. No distress.  HENT:  Head: Normocephalic and atraumatic.  Eyes: Conjunctivae and EOM are normal.  Right conjunctiva is injected. Pupil is reactive and the anterior chamber is clear. I see no foreign body under either lid. Fluorescein staining reveals no dendritic lesions.  Neck: Normal range of motion. Neck supple. No tracheal deviation present.  Musculoskeletal: Normal range of motion. He exhibits no edema.  Neurological: He is alert and oriented to person, place, and time. No cranial nerve deficit.  Skin: Skin is warm and dry.  Psychiatric: He has a normal mood and affect. His behavior is normal.    ED Course  Procedures (including critical care time)  DIAGNOSTIC STUDIES: Oxygen Saturation is 99% on room air, normal by my interpretation.    COORDINATION OF CARE: 6:51 PM-Discussed  treatment plan which includes eye exam with pt at bedside and pt agreed to plan.   7:0 PM-Discussed discharge plan which includes pain medications and acyclovir with pt and pt agreed to plan. Also advised pt to follow up with opthalmologist as needed and pt agreed.   Labs Review Labs Reviewed - No data to display Imaging Review No results found.  MDM  No diagnosis found. I see no evidence for a dendritic lesions, however the patient is adamant that this is a recurrence of his herpetic keratitis he will be treated with oral acyclovir and pain medication. I have advised him to followup with his ophthalmologist tomorrow for a recheck. The remainder the eye exam is unremarkable with the exception of injected  conjunctiva. I looked under the lids but did not see any foreign bodies.  I personally performed the services described in this documentation, which was scribed in my presence. The recorded information has been reviewed and is accurate.      Geoffery Lyons, MD 12/11/12 2144

## 2013-02-25 ENCOUNTER — Encounter: Payer: Self-pay | Admitting: Orthopedic Surgery

## 2013-02-25 ENCOUNTER — Ambulatory Visit: Payer: BC Managed Care – PPO | Admitting: Orthopedic Surgery

## 2013-04-14 ENCOUNTER — Encounter (HOSPITAL_COMMUNITY): Payer: Self-pay | Admitting: Emergency Medicine

## 2013-04-14 ENCOUNTER — Emergency Department (HOSPITAL_COMMUNITY)
Admission: EM | Admit: 2013-04-14 | Discharge: 2013-04-14 | Disposition: A | Discharge status: Home / Self Care | Payer: BC Managed Care – PPO | Attending: Emergency Medicine | Admitting: Emergency Medicine

## 2013-04-14 DIAGNOSIS — K047 Periapical abscess without sinus: Secondary | ICD-10-CM | POA: Insufficient documentation

## 2013-04-14 DIAGNOSIS — K029 Dental caries, unspecified: Secondary | ICD-10-CM | POA: Insufficient documentation

## 2013-04-14 DIAGNOSIS — IMO0002 Reserved for concepts with insufficient information to code with codable children: Secondary | ICD-10-CM | POA: Insufficient documentation

## 2013-04-14 DIAGNOSIS — Z8619 Personal history of other infectious and parasitic diseases: Secondary | ICD-10-CM | POA: Insufficient documentation

## 2013-04-14 DIAGNOSIS — F17200 Nicotine dependence, unspecified, uncomplicated: Secondary | ICD-10-CM | POA: Insufficient documentation

## 2013-04-14 DIAGNOSIS — Z79899 Other long term (current) drug therapy: Secondary | ICD-10-CM | POA: Insufficient documentation

## 2013-04-14 MED ORDER — HYDROCODONE-ACETAMINOPHEN 5-325 MG PO TABS: 1.0000 | ORAL_TABLET | ORAL | Status: DC | PRN

## 2013-04-14 MED ORDER — CLINDAMYCIN HCL 150 MG PO CAPS: 150.0000 mg | ORAL_CAPSULE | Freq: Four times a day (QID) | ORAL | Status: DC

## 2013-04-14 NOTE — ED Provider Notes (Signed)
CSN: [id number]     Arrival date & time 04/14/13  1316 History   First MD Initiated Contact with Patient 04/14/13 1502     Chief Complaint  Patient presents with  . Dental Pain   (Consider location/radiation/quality/duration/timing/severity/associated sxs/prior Treatment) Patient is a 43 y.o. male presenting with tooth pain. The history is provided by the patient.  Dental Pain Location:  Lower Lower teeth location:  31/RL 2nd molar Quality:  Throbbing and constant Onset quality:  Gradual Duration:  3 days Timing:  Constant Progression:  Worsening Chronicity:  New Context: abscess   Relieved by:  Nothing Worsened by:  Cold food/drink, touching, jaw movement and pressure Ineffective treatments:  NSAIDs Associated symptoms: facial pain   Associated symptoms: no trismus    Raymond Simpson is a 43 y.o. male who presents to the ED with dental pain and facial swelling that started a few days ago. He has taken ibuprofen without relief. He has an appointment with his dentist but the first thing he could get was Jan. 21st.  Past Medical History  Diagnosis Date  . Herpes     right eye   Past Surgical History  Procedure Laterality Date  . Knee surgery    . Hernia repair     No family history on file. History  Substance Use Topics  . Smoking status: Current Every Day Smoker    Types: Cigarettes  . Smokeless tobacco: Not on file  . Alcohol Use: Yes     Comment: Occ    Review of Systems Negative except as stated in HPI  Allergies  Review of patient's allergies indicates no known allergies.  Home Medications   Current Outpatient Rx  Name  Route  Sig  Dispense  Refill  . acyclovir (ZOVIRAX) 400 MG tablet   Oral   Take 1 tablet (400 mg total) by mouth 5 (five) times daily.   40 tablet   0   . acyclovir (ZOVIRAX) 800 MG tablet   Oral   Take 0.5 tablets (400 mg total) by mouth 5 (five) times daily.   35 tablet   0   . clindamycin (CLEOCIN) 150 MG capsule   Oral  Take 1 capsule (150 mg total) by mouth every 6 (six) hours.   28 capsule   0   . HYDROcodone-acetaminophen (NORCO/VICODIN) 5-325 MG per tablet   Oral   Take 1 tablet by mouth every 4 (four) hours as needed.   15 tablet   0   . Multiple Vitamin (MULTIVITAMIN WITH MINERALS) TABS   Oral   Take 1 tablet by mouth daily. MEGA MENS MULTIVITAMIN         . predniSONE (DELTASONE) 10 MG tablet      6,5,4,3,2,1 - take with food   21 tablet   0    BP 149/99  Pulse 86  Temp(Src) 99 F (37.2 C) (Oral)  Resp 18  Ht 5\' 6"  (1.676 m)  Wt 160 lb (72.576 kg)  BMI 25.84 kg/m2  SpO2 100% Physical Exam  Nursing note and vitals reviewed. Constitutional: He is oriented to person, place, and time. He appears well-developed and well-nourished. No distress.  HENT:  Right Ear: Tympanic membrane normal.  Left Ear: Tympanic membrane normal.  Nose: Nose normal.  Mouth/Throat: Uvula is midline. Dental abscesses and dental caries present.    Swelling and tenderness left jaw area and abscess noted at the gum line of the second left lower molar.   Eyes: Conjunctivae and EOM are normal.  Pupils are equal, round, and reactive to light.  Neck: Normal range of motion. Neck supple.  Cardiovascular: Normal rate and regular rhythm.   Pulmonary/Chest: Effort normal and breath sounds normal.  Musculoskeletal: Normal range of motion.  Neurological: He is alert and oriented to person, place, and time. No cranial nerve deficit.  Skin: Skin is warm and dry.  Psychiatric: He has a normal mood and affect. His behavior is normal.    ED Course  Procedures  MDM  43 y.o. male with dental abscess. Will start antibiotics and pain management. He will keep his appointment with his dentist on Jan 21st. He will return for problems. Stable for discharge. He does not appear septic at this time.  Discussed with the patient elevated BP and need for follow up. He states he has never had problems and thinks he is just in so  much pain it is elevated. He does agree to have it followed up by his PCP.      Janne NapoleonHope M Darek Eifler, [place]NP 04/14/13 1601

## 2013-04-14 NOTE — ED Provider Notes (Signed)
Medical screening examination/treatment/procedure(s) were performed by non-physician practitioner and as supervising physician I was immediately available for consultation/collaboration.  EKG Interpretation   None         Shelda JakesScott W. Mariesa Grieder, MD 04/14/13 2117

## 2013-04-14 NOTE — ED Notes (Signed)
Pt with swelling to left face since last night with dental pain as well, took Percocet left over from last prescription and vomited x 1 after taking, pt states has an appt on the 21st for tooth extraction, denies seeing a dentist

## 2013-04-14 NOTE — ED Notes (Signed)
Pt reports abcess to left lower tooth. Unsure which one "i need to get them all pulled"

## 2013-04-14 NOTE — ED Notes (Signed)
Explained to pt and pt verbalized understanding of following up with PCP due to BP elevated today

## 2015-01-24 ENCOUNTER — Emergency Department (HOSPITAL_COMMUNITY)
Admission: EM | Admit: 2015-01-24 | Discharge: 2015-01-24 | Disposition: A | Discharge status: Home / Self Care | Payer: BLUE CROSS/BLUE SHIELD | Attending: Emergency Medicine | Admitting: Emergency Medicine

## 2015-01-24 ENCOUNTER — Encounter (HOSPITAL_COMMUNITY): Payer: Self-pay | Admitting: Emergency Medicine

## 2015-01-24 DIAGNOSIS — H53141 Visual discomfort, right eye: Secondary | ICD-10-CM | POA: Insufficient documentation

## 2015-01-24 DIAGNOSIS — Z8619 Personal history of other infectious and parasitic diseases: Secondary | ICD-10-CM | POA: Diagnosis not present

## 2015-01-24 DIAGNOSIS — Z79899 Other long term (current) drug therapy: Secondary | ICD-10-CM | POA: Diagnosis not present

## 2015-01-24 DIAGNOSIS — Z720 Tobacco use: Secondary | ICD-10-CM | POA: Insufficient documentation

## 2015-01-24 DIAGNOSIS — H5711 Ocular pain, right eye: Secondary | ICD-10-CM | POA: Insufficient documentation

## 2015-01-24 DIAGNOSIS — R51 Headache: Secondary | ICD-10-CM | POA: Diagnosis not present

## 2015-01-24 MED ORDER — HYDROCODONE-ACETAMINOPHEN 5-325 MG PO TABS: 1.0000 | ORAL_TABLET | ORAL | Status: DC | PRN

## 2015-01-24 MED ORDER — ACYCLOVIR 400 MG PO TABS: 400.0000 mg | ORAL_TABLET | Freq: Every day | ORAL | Status: DC

## 2015-01-24 MED ORDER — HYDROCODONE-ACETAMINOPHEN 5-325 MG PO TABS
ORAL_TABLET | ORAL | Status: AC
  Filled 2015-01-24: qty 1

## 2015-01-24 MED ORDER — HYDROCODONE-ACETAMINOPHEN 5-325 MG PO TABS
1.0000 | ORAL_TABLET | Freq: Once | ORAL | Status: AC
Start: 2015-01-24 — End: 2015-01-24
  Administered 2015-01-24: 1 via ORAL
  Filled 2015-01-24: qty 1

## 2015-01-24 MED ORDER — PREDNISONE 10 MG PO TABS
ORAL_TABLET | ORAL | Status: DC
Start: 2015-01-24 — End: 2016-05-29

## 2015-01-24 MED ORDER — TETRACAINE HCL 0.5 % OP SOLN
1.0000 [drp] | Freq: Once | OPHTHALMIC | Status: AC
  Administered 2015-01-24 (×2): 1 [drp] via OPHTHALMIC
  Filled 2015-01-24: qty 2

## 2015-01-24 MED ORDER — TETRACAINE HCL 0.5 % OP SOLN
1.0000 [drp] | Freq: Once | OPHTHALMIC | Status: AC
  Administered 2015-01-24: 1 [drp] via OPHTHALMIC

## 2015-01-24 MED ORDER — KETOROLAC TROMETHAMINE 0.5 % OP SOLN
1.0000 [drp] | Freq: Once | OPHTHALMIC | Status: AC
  Administered 2015-01-24: 1 [drp] via OPHTHALMIC
  Filled 2015-01-24: qty 5

## 2015-01-24 MED ORDER — HYDROCODONE-ACETAMINOPHEN 5-325 MG PO TABS
1.0000 | ORAL_TABLET | Freq: Once | ORAL | Status: AC
  Administered 2015-01-24: 1 via ORAL

## 2015-01-24 MED ORDER — TETRACAINE HCL 0.5 % OP SOLN
OPHTHALMIC | Status: AC
  Administered 2015-01-24: 1 [drp] via OPHTHALMIC
  Filled 2015-01-24: qty 2

## 2015-01-24 NOTE — ED Notes (Signed)
PA at bedside.

## 2015-01-24 NOTE — ED Notes (Signed)
Pt c/o of RT eye pain and HA x 1 day. Pt states he has herpes simplex in his eye and he is having a "flare up." Pt denies vision changes.

## 2015-01-24 NOTE — ED Provider Notes (Signed)
CSN: [id number]     Arrival date & time 01/24/15  1052 History  By signing my name below, I, Raymond Simpson, attest that this documentation has been prepared under the direction and in the presence of Burgess AmorJulie Tatiyana Foucher, PA-C. Electronically Signed: Tanda RockersMargaux Simpson, ED Scribe. 01/24/2015. 12:12 PM.  Chief Complaint  Patient presents with  . Eye Pain   The history is provided by the patient. No language interpreter was used.     HPI Comments: Vevelyn RoyalsBruce A Salahuddin is a 44 y.o. male who presents to the Emergency Department complaining of gradual onset, constant, severe, scratching, right eye pain x 1 day. Pt has hx of herpes in the right eye (diagnosed approximately 5 years ago). He usually has flare ups twice per year and states he is currently having a flare up. He notes fine bumps on his eyelids which is scratching his eye, causing the pain. He denies history of herpes actually on his eyeball, flares are limited to the internal eyelid. Pt also complains of photophobia and right sided headache which is typical of the herpes flare. Denies any visual changes but mentions he did lose his glasses yesterday and is having trouble seeing because he does not have his correct lenses. Pt does not wear contacts. Denies fever, chills, or any other associated symptoms. He has been seen here in the ED in the past and prescribed Acyclovir with relief after a few days. Pt recently moved to [place] and does not currently have a PCP in the area that he could follow up with.   Past Medical History  Diagnosis Date  . Herpes     right eye   Past Surgical History  Procedure Laterality Date  . Knee surgery    . Hernia repair     No family history on file. Social History  Substance Use Topics  . Smoking status: Current Every Day Smoker    Types: Cigarettes  . Smokeless tobacco: None  . Alcohol Use: Yes     Comment: Occ    Review of Systems  Constitutional: Negative for fever and chills.  HENT: Negative for  congestion and sore throat.   Eyes: Positive for photophobia, pain (Right) and redness. Negative for visual disturbance.  Respiratory: Negative for chest tightness and shortness of breath.   Cardiovascular: Negative for chest pain.  Gastrointestinal: Negative for nausea and abdominal pain.  Genitourinary: Negative.   Musculoskeletal: Negative for joint swelling, arthralgias and neck pain.  Skin: Negative.  Negative for rash and wound.  Neurological: Positive for headaches. Negative for dizziness, weakness, light-headedness and numbness.  Psychiatric/Behavioral: Negative.    Allergies  Review of patient's allergies indicates no known allergies.  Home Medications   Prior to Admission medications   Medication Sig Start Date End Date Taking? Authorizing Provider  acyclovir (ZOVIRAX) 400 MG tablet Take 1 tablet (400 mg total) by mouth 5 (five) times daily. 01/24/15   Burgess AmorJulie Keah Lamba, PA-C  clindamycin (CLEOCIN) 150 MG capsule Take 1 capsule (150 mg total) by mouth every 6 (six) hours. 04/14/13   Hope Orlene OchM Neese, NP  HYDROcodone-acetaminophen (NORCO/VICODIN) 5-325 MG tablet Take 1 tablet by mouth every 4 (four) hours as needed. 01/24/15   Burgess AmorJulie Kallon Caylor, PA-C  Multiple Vitamin (MULTIVITAMIN WITH MINERALS) TABS Take 1 tablet by mouth daily. MEGA MENS MULTIVITAMIN    Historical Provider, MD  predniSONE (DELTASONE) 10 MG tablet 6, 5, 4, 3, 2 then 1 tablet by mouth daily for 6 days total. 01/24/15   Burgess AmorJulie Lochlin Eppinger, PA-C  Triage Vitals: BP 130/89 mmHg  Pulse 97  Temp(Src) 98 F (36.7 C) (Oral)  Resp 18  Ht  (1.676 m)  Wt 155 lb (70.308 kg)  BMI 25.03 kg/m2  SpO2 100%   Physical Exam  Constitutional: He appears well-developed and well-nourished.  Appears uncomfortable  HENT:  Head: Normocephalic and atraumatic.  Eyes: EOM are normal. Pupils are equal, round, and reactive to light.  Right lower palpebral conjunctival erythema without rash No dye uptake on fluorescein stain, no corneal ulcers or  dendritic lesions. No rash or vesicles No discharge No perioribital edema Right eye pressures: 2,3, and 4 mmHg  Neck: Normal range of motion.  Cardiovascular: Normal rate.   Pulmonary/Chest: Effort normal and breath sounds normal. He has no wheezes.  Abdominal: Soft. Bowel sounds are normal. There is no tenderness.  Musculoskeletal: Normal range of motion.  Neurological: He is alert.  Skin: Skin is warm and dry.  Psychiatric: He has a normal mood and affect.  Nursing note and vitals reviewed.   ED Course  Procedures (including critical care time)  DIAGNOSTIC STUDIES: Oxygen Saturation is 100% on RA, normal by my interpretation.    COORDINATION OF CARE: 12:08 PM-Discussed treatment plan which includes tetracaine eye drops, fluorescein strip test, and Rx acyclovir with pt at bedside and pt agreed to plan.   Labs Review Labs Reviewed - No data to display  Imaging Review No results found.   EKG Interpretation None      MDM   Final diagnoses:  Eye pain, right   No exam findings suggesting herpes flare, but will tx for such based on pt hx.  He was placed on acyclovir, also given hydrocodone and prednisone for pain and inflammation.  Also gave ketorolac gtts for localized pain and inflammation.  Advised f/u with ophthalmology for recheck, referral given. Return here for any worsening sx.  I personally performed the services described in this documentation, which was scribed in my presence. The recorded information has been reviewed and is accurate.    Burgess Amor, PA-C 01/27/15 1123  Leta Baptist, MD 01/27/15 1150

## 2015-01-24 NOTE — Discharge Instructions (Signed)
Take the medicines prescribed, make sure you take the entire course of the acyclovir.  You may apply one drop of the ketorolac given in your right eye every 4 hours if this helps with pain relief.  You may take the hydrocodone prescribed for pain relief.  This will make you drowsy - do not drive within 4 hours of taking this medication.

## 2016-05-28 ENCOUNTER — Other Ambulatory Visit: Payer: Self-pay | Admitting: *Deleted

## 2016-05-28 DIAGNOSIS — M25562 Pain in left knee: Secondary | ICD-10-CM

## 2016-05-29 ENCOUNTER — Ambulatory Visit (HOSPITAL_COMMUNITY)
Admission: RE | Admit: 2016-05-29 | Discharge: 2016-05-29 | Disposition: A | Discharge status: Home / Self Care | Payer: BLUE CROSS/BLUE SHIELD | Source: Ambulatory Visit | Attending: Orthopedic Surgery | Admitting: Orthopedic Surgery

## 2016-05-29 ENCOUNTER — Ambulatory Visit (INDEPENDENT_AMBULATORY_CARE_PROVIDER_SITE_OTHER): Payer: BLUE CROSS/BLUE SHIELD | Admitting: Orthopedic Surgery

## 2016-05-29 VITALS — BP 116/79 | HR 88 | Ht 68.0 in | Wt 145.0 lb

## 2016-05-29 DIAGNOSIS — M23322 Other meniscus derangements, posterior horn of medial meniscus, left knee: Secondary | ICD-10-CM | POA: Diagnosis not present

## 2016-05-29 DIAGNOSIS — M25462 Effusion, left knee: Secondary | ICD-10-CM | POA: Insufficient documentation

## 2016-05-29 DIAGNOSIS — M25562 Pain in left knee: Secondary | ICD-10-CM

## 2016-05-29 DIAGNOSIS — M539 Dorsopathy, unspecified: Secondary | ICD-10-CM | POA: Diagnosis not present

## 2016-05-29 DIAGNOSIS — M5386 Other specified dorsopathies, lumbar region: Secondary | ICD-10-CM

## 2016-05-29 MED ORDER — GABAPENTIN 100 MG PO CAPS: 100.0000 mg | ORAL_CAPSULE | Freq: Every day | ORAL | 2 refills | Status: DC

## 2016-05-29 MED ORDER — PREDNISONE 10 MG PO TABS: 10.0000 mg | ORAL_TABLET | Freq: Two times a day (BID) | ORAL | 0 refills | Status: DC

## 2016-05-29 NOTE — Patient Instructions (Addendum)
TAKE THESE MEDICATIONS  Meds ordered this encounter  Medications  . predniSONE (DELTASONE) 10 MG tablet    Sig: Take 1 tablet (10 mg total) by mouth 2 (two) times daily with a meal.    Dispense:  60 tablet    Refill:  0  . gabapentin (NEURONTIN) 100 MG capsule    Sig: Take 1 capsule (100 mg total) by mouth at bedtime.    Dispense:  30 capsule    Refill:  2     Sciatica Introduction Sciatica is pain, numbness, weakness, or tingling along your sciatic nerve. The sciatic nerve starts in the lower back and goes down the back of each leg. Sciatica happens when this nerve is pinched or has pressure put on it. Sciatica usually goes away on its own or with treatment. Sometimes, sciatica may keep coming back (recur). Follow these instructions at home: Medicines  Take over-the-counter and prescription medicines only as told by your doctor.  Do not drive or use heavy machinery while taking prescription pain medicine. Managing pain  If directed, put ice on the affected area.  Put ice in a plastic bag.  Place a towel between your skin and the bag.  Leave the ice on for 20 minutes, 2-3 times a day.  After icing, apply heat to the affected area before you exercise or as often as told by your doctor. Use the heat source that your doctor tells you to use, such as a moist heat pack or a heating pad.  Place a towel between your skin and the heat source.  Leave the heat on for 20-30 minutes.  Remove the heat if your skin turns bright red. This is especially important if you are unable to feel pain, heat, or cold. You may have a greater risk of getting burned. Activity  Return to your normal activities as told by your doctor. Ask your doctor what activities are safe for you.  Avoid activities that make your sciatica worse.  Take short rests during the day. Rest in a lying or standing position. This is usually better than sitting to rest.  When you rest for a long time, do some physical  activity or stretching between periods of rest.  Avoid sitting for a long time without moving. Get up and move around at least one time each hour.  Exercise and stretch regularly, as told by your doctor.  Do not lift anything that is heavier than 10 lb (4.5 kg) while you have symptoms of sciatica.  Avoid lifting heavy things even when you do not have symptoms.  Avoid lifting heavy things over and over.  When you lift objects, always lift in a way that is safe for your body. To do this, you should:  Bend your knees.  Keep the object close to your body.  Avoid twisting. General instructions  Use good posture.  Avoid leaning forward when you are sitting.  Avoid hunching over when you are standing.  Stay at a healthy weight.  Wear comfortable shoes that support your feet. Avoid wearing high heels.  Avoid sleeping on a mattress that is too soft or too hard. You might have less pain if you sleep on a mattress that is firm enough to support your back.  Keep all follow-up visits as told by your doctor. This is important. Contact a doctor if:  You have pain that:  Wakes you up when you are sleeping.  Gets worse when you lie down.  Is worse than the pain  you have had in the past.  Lasts longer than 4 weeks.  You lose weight for without trying. Get help right away if:  You cannot control when you pee (urinate) or poop (have a bowel movement).  You have weakness in any of these areas and it gets worse.  Lower back.  Lower belly (pelvis).  Butt (buttocks).  Legs.  You have redness or swelling of your back.  You have a burning feeling when you pee. This information is not intended to replace advice given to you by your health care provider. Make sure you discuss any questions you have with your health care provider. Document Released: 01/03/2008 Document Revised: 09/01/2015 Document Reviewed: 12/03/2014  2017 Elsevier

## 2016-05-29 NOTE — Addendum Note (Signed)
Addended by: Adella HareBOOTHE, Harry Bark B on: 05/29/2016 10:13 AM   Modules accepted: Orders, SmartSet

## 2016-05-29 NOTE — Progress Notes (Signed)
Patient ID: Raymond Simpson, male   DOB: 06/12/1970, 46 y.o.   MRN: [id number]   Chief Complaint  Patient presents with  . Knee Pain    left knee pain, xray at [facility].    Raymond Simpson is a 46 y.o. male.   HPI Patient reports medial left knee pain, history of torn meniscus which he did not get fixed. Diagnosed 5 years ago with MRI.  Comes in complaining of dull aching pain around his left knee which is also associated with mechanical symptoms of giving way, radiation of pain into his left hip pain in the posterior popliteal fossa and intermediate swelling   Review of Systems Review of Systems  Constitutional: Negative for chills, fever and weight loss.  Respiratory: Negative for shortness of breath.   Cardiovascular: Negative for chest pain.  Neurological: Negative for tingling.  Left leg feels heavy, pain radiates from knee to the hip going across the lateral thigh  Past Medical History:  Diagnosis Date  . Herpes    right eye    Past Surgical History:  Procedure Laterality Date  . HERNIA REPAIR    . KNEE SURGERY      No Known Allergies  Current Outpatient Prescriptions  Medication Sig Dispense Refill  . acyclovir (ZOVIRAX) 400 MG tablet Take 1 tablet (400 mg total) by mouth 5 (five) times daily. 50 tablet 0  . clindamycin (CLEOCIN) 150 MG capsule Take 1 capsule (150 mg total) by mouth every 6 (six) hours. 28 capsule 0  . gabapentin (NEURONTIN) 100 MG capsule Take 1 capsule (100 mg total) by mouth at bedtime. 30 capsule 2  . HYDROcodone-acetaminophen (NORCO/VICODIN) 5-325 MG tablet Take 1 tablet by mouth every 4 (four) hours as needed. 15 tablet 0  . Multiple Vitamin (MULTIVITAMIN WITH MINERALS) TABS Take 1 tablet by mouth daily. MEGA MENS MULTIVITAMIN    . predniSONE (DELTASONE) 10 MG tablet Take 1 tablet (10 mg total) by mouth 2 (two) times daily with a meal. 60 tablet 0   No current facility-administered medications for this visit.      Physical Exam BP  116/79   Pulse 88   Ht 5\' 8"  (1.727 m)   Wt 145 lb (65.8 kg)   BMI 22.05 kg/m  Physical Exam   The patient is well developed well nourished and well groomed.   Orientation to person place and time is normal   Mood is pleasant. Affect normal  Ambulatory status is remarkable for a limp in the involved extremity         LEFT Knee examination:  Inspection: Tenderness is noted over the medial joint line   ROM: Is limited by pain with a maximum flexion arc of 125   Stability: Collateral ligaments are stable, the Lachman test and anterior and posterior drawer tests are normal   We do palpate medial joint line tenderness and a positive McMurray's for medial meniscal tear   Motor exam: Grade 5 motor strength in the quadriceps musculature   Skin: Warm dry and intact over the right leg                       Neuro: normal sensation   Vascular: 2+ DP pulse with normal color and no edema.   Currently the RIGHT  lower extremity and knee examination revealed no tenderness or swelling, full range of motion without contracture subluxation atrophy or tremor. Normal muscle tone no instability and the neurovascular status of the limb  is normal.  The lower back is nontender but he does have a positive straight leg raise on the left side at 45  Assessment and Plan:  IMAGING: I have read and interpret the xrays as follows: KNEE ARTHRITIS MEDIAL COMPARTMENT    Diagnosis and treatment:  Encounter Diagnoses  Name Primary?  . Sciatica associated with disorder of lumbar spine Yes  . Derangement of posterior horn of medial meniscus of left knee     Osteoarthritis of the knee Torn medial meniscus   Fuller Canada, MD 05/29/2016 9:57 AM

## 2016-06-07 ENCOUNTER — Ambulatory Visit (HOSPITAL_COMMUNITY): Payer: BLUE CROSS/BLUE SHIELD

## 2016-11-15 ENCOUNTER — Encounter (HOSPITAL_COMMUNITY): Payer: Self-pay | Admitting: Emergency Medicine

## 2016-11-15 ENCOUNTER — Ambulatory Visit (HOSPITAL_COMMUNITY)
Admission: EM | Admit: 2016-11-15 | Discharge: 2016-11-15 | Disposition: A | Discharge status: Home / Self Care | Payer: BLUE CROSS/BLUE SHIELD | Attending: Internal Medicine | Admitting: Internal Medicine

## 2016-11-15 DIAGNOSIS — H5711 Ocular pain, right eye: Secondary | ICD-10-CM | POA: Diagnosis not present

## 2016-11-15 NOTE — ED Triage Notes (Signed)
Patient says he was diagnosed with herpes in right eye years ago and is currently having an episode that started on Tuesday.  Patient says this is a 20 year history with flare ups about every 2 years.  Unable to remember doctor that first diagnosed him.  .Marland Kitchen

## 2016-11-15 NOTE — Discharge Instructions (Signed)
Go to [facility] immediately.

## 2016-11-15 NOTE — ED Provider Notes (Signed)
[facility]    CSN: [id number] Arrival date & time: 11/15/16  1207     History   Chief Complaint Chief Complaint  Patient presents with  . Eye Problem    HPI Raymond Simpson is a 46 y.o. male.   46 year old male with history of right eye herpes zoster comes in for "flareup". He states he was diagnosed 5 years ago by ophthalmologist, and since then he's had a flareup every 2 years. Current episode started a few days ago, with pain of the right eye, blurry vision, headache, photophobia, phonophobia. He states that he was treated with steroids in the past, and symptoms would go away. He has had some nausea, but has since resolved, no episodes of vomiting. Denies weakness, dizziness, syncope. He has had history of concussions, and possible migraines.       Past Medical History:  Diagnosis Date  . Herpes    right eye    There are no active problems to display for this patient.   Past Surgical History:  Procedure Laterality Date  . HERNIA REPAIR    . KNEE SURGERY         Home Medications    Prior to Admission medications   Medication Sig Start Date End Date Taking? Authorizing Provider  acyclovir (ZOVIRAX) 400 MG tablet Take 1 tablet (400 mg total) by mouth 5 (five) times daily. 01/24/15   Burgess Amor, PA-C  clindamycin (CLEOCIN) 150 MG capsule Take 1 capsule (150 mg total) by mouth every 6 (six) hours. 04/14/13   Janne Napoleon, NP  gabapentin (NEURONTIN) 100 MG capsule Take 1 capsule (100 mg total) by mouth at bedtime. 05/29/16   Vickki Hearing, MD  HYDROcodone-acetaminophen (NORCO/VICODIN) 5-325 MG tablet Take 1 tablet by mouth every 4 (four) hours as needed. 01/24/15   Burgess Amor, PA-C  Multiple Vitamin (MULTIVITAMIN WITH MINERALS) TABS Take 1 tablet by mouth daily. MEGA MENS MULTIVITAMIN    [provider]  predniSONE (DELTASONE) 10 MG tablet Take 1 tablet (10 mg total) by mouth 2 (two) times daily with a meal. 05/29/16   Vickki Hearing,  MD    Family History No family history on file.  Social History Social History  Substance Use Topics  . Smoking status: Current Every Day Smoker    Types: Cigarettes  . Smokeless tobacco: Not on file  . Alcohol use Yes     Comment: Occ     Allergies   Patient has no known allergies.   Review of Systems Review of Systems  Reason unable to perform ROS: See HPI as above.     Physical Exam Triage Vital Signs ED Triage Vitals  Enc Vitals Group     BP 11/15/16 1224 (!) 147/83     Pulse Rate 11/15/16 1224 87     Resp 11/15/16 1224 18     Temp 11/15/16 1224 98.6 F (37 C)     Temp Source 11/15/16 1224 Oral     SpO2 11/15/16 1224 99 %     Weight 11/15/16 1225 150 lb (68 kg)     Height 11/15/16 1225 5\' 6"  (1.676 m)     Head Circumference --      Peak Flow --      Pain Score 11/15/16 1236 7     Pain Loc --      Pain Edu? --      Excl. in GC? --    No data found.   Updated Vital Signs  BP (!) 147/83 (BP Location: Left Arm)   Pulse 87   Temp 98.6 F (37 C) (Oral)   Resp 18   Ht 5\' 6"  (1.676 m)   Wt 150 lb (68 kg)   SpO2 99%   BMI 24.21 kg/m   Visual Acuity Right Eye Distance:  20/50 (CC)  Left Eye Distance:  20/50 (CC) Bilateral Distance:    Right Eye Near:   Left Eye Near:    Bilateral Near:     Physical Exam  Constitutional: He is oriented to person, place, and time. He appears well-developed and well-nourished. No distress.  HENT:  Head: Normocephalic and atraumatic.  Eyes: Pupils are equal, round, and reactive to light. EOM and lids are normal. Right conjunctiva is injected.  Fluorescein stain without corneal abrasion, ulcers, keratitis.  Tono-Pen IOP: 10-13  Cardiovascular: Normal rate, regular rhythm and normal heart sounds.  Exam reveals no gallop and no friction rub.   No murmur heard. Pulmonary/Chest: Effort normal and breath sounds normal. He has no wheezes. He has no rales.  Neurological: He is alert and oriented to person, place, and  time. He has normal strength. No cranial nerve deficit or sensory deficit. He displays a negative Romberg sign.     UC Treatments / Results  Labs (all labs ordered are listed, but only abnormal results are displayed) Labs Reviewed - No data to display  EKG  EKG Interpretation None       Radiology No results found.  Procedures Procedures (including critical care time)  Medications Ordered in UC Medications - No data to display   Initial Impression / Assessment and Plan / UC Course  I have reviewed the triage vital signs and the nursing notes.  Pertinent labs & imaging results that were available during my care of the patient were reviewed by me and considered in my medical decision making (see chart for details).    Discussed case with on-call ophthalmologist, Dr. Sherryll BurgerShah, who would like to see the patient as soon as possible. Patient discharged in stable condition, directions given to patient for [facility] to be seen today as per Dr Sherryll BurgerShah. Discussed with patient possibility of migraines of the cause of his symptoms, if herpes zoster ophthalmicus is ruled out, patient to follow up with PCP for further evaluation of migraines.   Final Clinical Impressions(s) / UC Diagnoses   Final diagnoses:  Acute right eye pain    New Prescriptions Discharge Medication List as of 11/15/2016  1:43 PM        Belinda FisherYu, Namita Yearwood V, PA-C 11/15/16 1351

## 2016-11-16 DIAGNOSIS — H40003 Preglaucoma, unspecified, bilateral: Secondary | ICD-10-CM | POA: Diagnosis not present

## 2017-03-11 ENCOUNTER — Ambulatory Visit (INDEPENDENT_AMBULATORY_CARE_PROVIDER_SITE_OTHER): Payer: BLUE CROSS/BLUE SHIELD | Admitting: Orthopedic Surgery

## 2017-03-11 ENCOUNTER — Encounter: Payer: Self-pay | Admitting: Orthopedic Surgery

## 2017-03-11 VITALS — BP 151/98 | HR 101 | Ht 68.0 in | Wt 142.0 lb

## 2017-03-11 DIAGNOSIS — S83242D Other tear of medial meniscus, current injury, left knee, subsequent encounter: Secondary | ICD-10-CM | POA: Diagnosis not present

## 2017-03-11 DIAGNOSIS — M5126 Other intervertebral disc displacement, lumbar region: Secondary | ICD-10-CM

## 2017-03-11 DIAGNOSIS — M5386 Other specified dorsopathies, lumbar region: Secondary | ICD-10-CM

## 2017-03-11 MED ORDER — TRAMADOL-ACETAMINOPHEN 37.5-325 MG PO TABS: 1.0000 | ORAL_TABLET | ORAL | 5 refills | Status: DC | PRN

## 2017-03-11 MED ORDER — GABAPENTIN 100 MG PO CAPS: 100.0000 mg | ORAL_CAPSULE | Freq: Every day | ORAL | 2 refills | Status: DC

## 2017-03-11 MED ORDER — PREDNISONE 10 MG (48) PO TBPK: ORAL_TABLET | Freq: Every day | ORAL | 1 refills | Status: DC

## 2017-03-11 NOTE — Progress Notes (Signed)
Progress Note   Patient ID: Raymond Simpson, male   DOB: 11/13/1970, 46 y.o.   MRN: [id number]  Chief Complaint  Patient presents with  . Knee Pain    left    46 year old male originally scheduled for MRI of his left knee for torn medial meniscus however he comes in today complaining of severe pain in his left lower back radiating down to his left lower leg.  He is been treated previously with gabapentin with some relief but since I last saw him about 6 or 8 months ago he has had progressively increasing pain numbness and tingling in the left lower extremity radiating down into the left foot primarily in the L5 root distribution.  Summation location left lower back and left leg radiating quality dull aching severity 7+ out of 10 timing constant duration over 6 months context no recent trauma modifying factors seems to be worse when walking.  He has to take frequent breaks when he is walking because of pain     Review of Systems  Respiratory: Negative.   Cardiovascular: Negative.   Gastrointestinal: Negative.   Genitourinary: Negative.   Musculoskeletal: Positive for back pain and joint pain.  Neurological: Positive for tingling and sensory change.   Current Meds  Medication Sig  . Multiple Vitamin (MULTIVITAMIN WITH MINERALS) TABS Take 1 tablet by mouth daily. MEGA MENS MULTIVITAMIN    Past Medical History:  Diagnosis Date  . Herpes    right eye     No Known Allergies  BP (!) 151/98   Pulse (!) 101   Ht 5\' 8"  (1.727 m)   Wt 142 lb (64.4 kg)   BMI 21.59 kg/m    Physical Exam  Constitutional: He is oriented to person, place, and time. He appears well-developed and well-nourished.  Vital signs have been reviewed and are stable. Gen. appearance the patient is well-developed and well-nourished with normal grooming and hygiene.   Musculoskeletal:  GAIT IS abnormal favoring the left leg  Neurological: He is alert and oriented to person, place, and time.  Skin: Skin is  warm and dry. No erythema.  Psychiatric: He has a normal mood and affect.  Vitals reviewed.   Ortho Exam  Lumbar spine tenderness in the lower left side of his back with increased muscle tension no palpable deformity.  Skin without hairy patches or congenital lesions  Left knee tenderness over the medial joint line no.  Strength is normal skin was intact positive McMurray sign medial side distal pulses were intact sensation was normal reflex was hyper reflexive at the knee 2+ at the left and right ankle  Straight leg raise positive on the left negative on the right Medical decision-making  Imaging: Lumbar films.  Report prior shows no specific bony abnormalities or fracture no spondylolysis or listhesis   Encounter Diagnoses  Name Primary?  . Acute medial meniscus tear of left knee, subsequent encounter   . Lumbar herniated disc Yes    MRI lumbar spine  Meds ordered this encounter  Medications  . predniSONE (STERAPRED UNI-PAK 48 TAB) 10 MG (48) TBPK tablet    Sig: Take by mouth daily.    Dispense:  48 tablet    Refill:  1  . traMADol-acetaminophen (ULTRACET) 37.5-325 MG tablet    Sig: Take 1 tablet by mouth every 4 (four) hours as needed.    Dispense:  90 tablet    Refill:  5  . gabapentin (NEURONTIN) 100 MG capsule    Sig: Take 1  capsule (100 mg total) by mouth at bedtime.    Dispense:  30 capsule    Refill:  2   Upon return we will reevaluate the left knee for possible surgery depending on what the MRI shows  Fuller CanadaStanley Marimar Suber, MD 03/11/2017 3:57 PM

## 2017-03-11 NOTE — Patient Instructions (Addendum)
Your MRI has been ordered.  We will contact your insurance company for approval. After the authorization is received the MRI and a return appointment will be scheduled with you by phone.  If you do not hear from us within 5 business days please call [phone] and ask for Amy Herniated Disk A herniated disk, also called a ruptured disk or slipped disk, occurs when a disk in the spine bulges out too far. Between the bones in the spine (vertebrae), there are oval disks that are made of a soft, spongy center that is surrounded by a tough outer ring. The disks connect your vertebrae, help your spine move, and absorb shocks from your movement. When you have a herniated disk, the spongy center of the disk bulges out or breaks through the outer ring. It can press on a nerve between the vertebrae and cause pain. This can occur anywhere in the back or neck area, but the lower back is most commonly affected. What are the causes? This condition may be caused by:  Age-related wear and tear. The spongy centers of spinal disks tend to shrink and dry out with age, which makes them more likely to herniate.  Sudden injury, such as a strain or sprain.  What increases the risk? Aging is the main risk factor for a herniated disk. Other risk factors include:  Being a man who is 30-50 years old.  Frequently doing activities that involve heavy lifting, bending, or twisting.  Frequently driving for long hours at a time.  Not getting enough exercise.  Being overweight.  Smoking.  Having a family history of back problems or herniated disks.  Being pregnant or giving birth.  Having poor nutrition.  Being tall.  What are the signs or symptoms? Symptoms may vary depending on where your herniated disk is located.  A herniated disk in the lower back may cause sharp pain in: ? Part of the arm, leg, hip, or buttocks. ? The back of the lower leg (calf). ? The lower back, spreading down through the leg into  the foot (sciatica).  A herniated disk in the neck may cause dizziness and vertigo. It may also cause pain or weakness in: ? The neck. ? The shoulder blades. ? Upper arm, forearm, or fingers.  You may also have muscle weakness. It may be difficult to: ? Lift your leg or arm. ? Stand on your toes. ? Squeeze tightly with one of your hands.  Other symptoms may include: ? Numbness or tingling in the affected areas of the hands, arms, feet, or legs. ? Inability to control when you urinate or when you have bowel movements. This is a rare but serious sign of a severe herniated disk in the lower back.  How is this diagnosed? This condition may be diagnosed based on:  Your symptoms.  Your medical history.  A physical exam. The exam may include: ? Straight-leg test. You will lie on your back while your health care provider lifts your leg, keeping your knee straight. If you feel pain, you likely have a herniated disk. ? Neurological tests. This includes checking for numbness, reflexes, muscle strength, and posture.  Imaging tests, such as: ? X-rays. ? MRI. ? CT scan. ? Electromyogram (EMG) to check the nerves that control muscles. This test may be used to determine which nerves are affected by your herniated disk.  How is this treated? Treatment for this condition may include:  A short period of rest. This is usually the first treatment. ?   You may be on bed rest for up to 2 days, or you may be instructed to stay home and avoid physical activity. ? If you have a herniated disk in your lower back, avoid sitting as much as possible. Sitting increases pressure on the disk.  Medicines. These may include: ? NSAIDs to help reduce pain and swelling. ? Muscle relaxants to prevent sudden tightening of the back muscles (back spasms). ? Prescription pain medicines, if you have severe pain.  Steroid injections in the area of the herniated disk. This can help reduce pain and swelling.  Physical  therapy to strengthen your back muscles.  In many cases, symptoms go away with treatment over a period of days or weeks. You will most likely be free of symptoms after 3-4 months. If other treatments do not help to relieve your symptoms, you may need surgery. Follow these instructions at home: Medicines  Take over-the-counter and prescription medicines only as told by your health care provider.  Do not drive or use heavy machinery while taking prescription pain medicine. Activity  Rest as directed.  After your rest period: ? Return to your normal activities and gradually begin exercising as told by your health care provider. Ask your health care provider what activities and exercises are safe for you. ? Use good posture. ? Avoid movements that cause pain. ? Do not lift anything that is heavier than 10 lb (4.5 kg) until your health care provider says this is safe. ? Do not sit or stand for long periods of time without changing positions. ? Do not sit for long periods of time without getting up and moving around.  If physical therapy was prescribed, do exercises as instructed.  Aim to strengthen muscles in your back and abdomen with exercises like crunches, swimming, or walking. General instructions  Do not use any products that contain nicotine or tobacco, such as cigarettes and e-cigarettes. These products can delay healing. If you need help quitting, ask your health care provider.  Do not wear high-heeled shoes.  Do not sleep on your belly.  If you are overweight, work with your health care provider to lose weight safely.  To prevent or treat constipation while you are taking prescription pain medicine, your health care provider may recommend that you: ? Drink enough fluid to keep your urine clear or pale yellow. ? Take over-the-counter or prescription medicines. ? Eat foods that are high in fiber, such as fresh fruits and vegetables, whole grains, and beans. ? Limit foods that  are high in fat and processed sugars, such as fried and sweet foods.  Keep all follow-up visits as told by your health care provider. This is important. How is this prevented?  Maintain a healthy weight.  Try to avoid stressful situations.  Maintain physical fitness. Do at least 150 minutes of moderate-intensity exercise each week, such as brisk walking or water aerobics.  When lifting objects: ? Keep your feet at least shoulder-width apart and tighten your abdominal muscles. ? Keep your spine neutral as you bend your knees and hips. It is important to lift using the strength of your legs, not your back. Do not lock your knees straight out. ? Always ask for help to lift heavy or awkward objects. Contact a health care provider if:  You have back pain or neck pain that does not get better after 6 weeks.  You have severe pain in your back, neck, legs, or arms.  You develop numbness, tingling, or weakness in any   part of your body. Get help right away if:  You cannot move your arms or legs.  You cannot control when you urinate or have bowel movements.  You feel dizzy or you faint.  You have shortness of breath. This information is not intended to replace advice given to you by your health care provider. Make sure you discuss any questions you have with your health care provider. Document Released: 03/23/2000 Document Revised: 11/21/2015 Document Reviewed: 11/21/2015 Elsevier Interactive Patient Education  2017 [employer]  

## 2017-03-20 ENCOUNTER — Ambulatory Visit (HOSPITAL_COMMUNITY): Payer: BLUE CROSS/BLUE SHIELD

## 2017-04-10 ENCOUNTER — Ambulatory Visit (HOSPITAL_COMMUNITY)
Admission: RE | Admit: 2017-04-10 | Discharge: 2017-04-10 | Disposition: A | Discharge status: Home / Self Care | Payer: BLUE CROSS/BLUE SHIELD | Source: Ambulatory Visit | Attending: Orthopedic Surgery | Admitting: Orthopedic Surgery

## 2017-04-10 DIAGNOSIS — M545 Low back pain: Secondary | ICD-10-CM | POA: Diagnosis not present

## 2017-04-10 DIAGNOSIS — M5386 Other specified dorsopathies, lumbar region: Secondary | ICD-10-CM | POA: Diagnosis not present

## 2017-04-10 DIAGNOSIS — M48061 Spinal stenosis, lumbar region without neurogenic claudication: Secondary | ICD-10-CM | POA: Insufficient documentation

## 2017-04-10 DIAGNOSIS — M5126 Other intervertebral disc displacement, lumbar region: Secondary | ICD-10-CM

## 2017-04-10 DIAGNOSIS — M5136 Other intervertebral disc degeneration, lumbar region: Secondary | ICD-10-CM | POA: Insufficient documentation

## 2017-04-29 ENCOUNTER — Other Ambulatory Visit: Payer: Self-pay | Admitting: Orthopedic Surgery

## 2017-05-07 ENCOUNTER — Ambulatory Visit (INDEPENDENT_AMBULATORY_CARE_PROVIDER_SITE_OTHER): Payer: BLUE CROSS/BLUE SHIELD | Admitting: Family Medicine

## 2017-05-07 ENCOUNTER — Other Ambulatory Visit: Payer: Self-pay

## 2017-05-07 ENCOUNTER — Encounter: Payer: Self-pay | Admitting: *Deleted

## 2017-05-07 VITALS — BP 152/88 | HR 64 | Temp 98.7°F | Resp 16 | Ht 68.0 in | Wt 142.0 lb

## 2017-05-07 DIAGNOSIS — M2550 Pain in unspecified joint: Secondary | ICD-10-CM | POA: Insufficient documentation

## 2017-05-07 DIAGNOSIS — R55 Syncope and collapse: Secondary | ICD-10-CM | POA: Insufficient documentation

## 2017-05-07 DIAGNOSIS — R030 Elevated blood-pressure reading, without diagnosis of hypertension: Secondary | ICD-10-CM

## 2017-05-07 DIAGNOSIS — N529 Male erectile dysfunction, unspecified: Secondary | ICD-10-CM

## 2017-05-07 DIAGNOSIS — Z0000 Encounter for general adult medical examination without abnormal findings: Secondary | ICD-10-CM

## 2017-05-07 DIAGNOSIS — R350 Frequency of micturition: Secondary | ICD-10-CM

## 2017-05-07 DIAGNOSIS — B360 Pityriasis versicolor: Secondary | ICD-10-CM | POA: Diagnosis not present

## 2017-05-07 DIAGNOSIS — M791 Myalgia, unspecified site: Secondary | ICD-10-CM

## 2017-05-07 DIAGNOSIS — F411 Generalized anxiety disorder: Secondary | ICD-10-CM | POA: Diagnosis not present

## 2017-05-07 DIAGNOSIS — Z125 Encounter for screening for malignant neoplasm of prostate: Secondary | ICD-10-CM | POA: Diagnosis not present

## 2017-05-07 LAB — URINALYSIS, ROUTINE W REFLEX MICROSCOPIC
BILIRUBIN URINE: NEGATIVE
Bacteria, UA: NONE SEEN /HPF
GLUCOSE, UA: NEGATIVE
Leukocytes, UA: NEGATIVE
Nitrite: NEGATIVE
PH: 5.5 (ref 5.0–8.0)
SPECIFIC GRAVITY, URINE: 1.025 (ref 1.001–1.03)
Squamous Epithelial / LPF: NONE SEEN /HPF (ref <=5)
WBC, UA: NONE SEEN /HPF (ref 0–5)

## 2017-05-07 LAB — MICROSCOPIC MESSAGE

## 2017-05-07 MED ORDER — SELENIUM SULFIDE 2.5 % EX LOTN: TOPICAL_LOTION | CUTANEOUS | 12 refills | Status: DC
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
## 2017-05-07 NOTE — Patient Instructions (Addendum)
Blood pressure should be  < 140/90 at home   Check your blood pressure 2-3 times a week and record  We will call with  Lab results  F/u 4 WEEKS

## 2017-05-08 ENCOUNTER — Encounter: Payer: Self-pay | Admitting: Orthopedic Surgery

## 2017-05-08 ENCOUNTER — Telehealth: Payer: Self-pay | Admitting: Orthopedic Surgery

## 2017-05-08 ENCOUNTER — Ambulatory Visit (INDEPENDENT_AMBULATORY_CARE_PROVIDER_SITE_OTHER): Payer: BLUE CROSS/BLUE SHIELD | Admitting: Orthopedic Surgery

## 2017-05-08 ENCOUNTER — Encounter: Payer: Self-pay | Admitting: Family Medicine

## 2017-05-08 VITALS — BP 183/110 | HR 105 | Ht 68.0 in | Wt 142.0 lb

## 2017-05-08 DIAGNOSIS — M9983 Other biomechanical lesions of lumbar region: Secondary | ICD-10-CM

## 2017-05-08 DIAGNOSIS — M48061 Spinal stenosis, lumbar region without neurogenic claudication: Secondary | ICD-10-CM

## 2017-05-08 LAB — CBC WITH DIFFERENTIAL/PLATELET
BASOS PCT: 0.5 %
Basophils Absolute: 29 cells/uL (ref 0–200)
EOS ABS: 41 {cells}/uL (ref 15–500)
Eosinophils Relative: 0.7 %
HCT: 49.8 % (ref 38.5–50.0)
HEMOGLOBIN: 16.5 g/dL (ref 13.2–17.1)
Lymphs Abs: 1177 cells/uL (ref 850–3900)
MCH: 31.6 pg (ref 27.0–33.0)
MCHC: 33.1 g/dL (ref 32.0–36.0)
MCV: 95.4 fL (ref 80.0–100.0)
MONOS PCT: 5.6 %
MPV: 10.4 fL (ref 7.5–12.5)
NEUTROS ABS: 4228 {cells}/uL (ref 1500–7800)
Neutrophils Relative %: 72.9 %
Platelets: 245 10*3/uL (ref 140–400)
RBC: 5.22 10*6/uL (ref 4.20–5.80)
RDW: 11.8 % (ref 11.0–15.0)
Total Lymphocyte: 20.3 %
WBC: 5.8 10*3/uL (ref 3.8–10.8)
WBCMIX: 325 {cells}/uL (ref 200–950)

## 2017-05-08 LAB — COMPREHENSIVE METABOLIC PANEL
AG RATIO: 1.7 (calc) (ref 1.0–2.5)
ALT: 16 U/L (ref 9–46)
AST: 16 U/L (ref 10–40)
Albumin: 4.7 g/dL (ref 3.6–5.1)
Alkaline phosphatase (APISO): 65 U/L (ref 40–115)
BUN: 14 mg/dL (ref 7–25)
CO2: 32 mmol/L (ref 20–32)
CREATININE: 1.14 mg/dL (ref 0.60–1.35)
Calcium: 10.1 mg/dL (ref 8.6–10.3)
Chloride: 100 mmol/L (ref 98–110)
GLOBULIN: 2.8 g/dL (ref 1.9–3.7)
GLUCOSE: 85 mg/dL (ref 65–99)
POTASSIUM: 4.7 mmol/L (ref 3.5–5.3)
Sodium: 138 mmol/L (ref 135–146)
Total Bilirubin: 0.8 mg/dL (ref 0.2–1.2)
Total Protein: 7.5 g/dL (ref 6.1–8.1)

## 2017-05-08 LAB — PSA: PSA: 0.7 ng/mL (ref <=4.0)

## 2017-05-08 LAB — LIPID PANEL
CHOL/HDL RATIO: 2.4 (calc) (ref <5.0)
Cholesterol: 212 mg/dL — ABNORMAL HIGH (ref <200)
HDL: 88 mg/dL (ref >40)
LDL Cholesterol (Calc): 108 mg/dL (calc) — ABNORMAL HIGH
NON-HDL CHOLESTEROL (CALC): 124 mg/dL (ref <130)
Triglycerides: 72 mg/dL (ref <150)

## 2017-05-08 LAB — TESTOSTERONE TOTAL,FREE,BIO, MALES
ALBUMIN MSPROF: 4.7 g/dL (ref 3.6–5.1)
SEX HORMONE BINDING: 38 nmol/L (ref 10–50)
TESTOSTERONE FREE: 54.3 pg/mL (ref 46.0–224.0)
TESTOSTERONE: 464 ng/dL (ref 250–827)
Testosterone, Bioavailable: 116.5 ng/dL (ref >=110.0)

## 2017-05-08 LAB — SEDIMENTATION RATE: SED RATE: 6 mm/h (ref 0–15)

## 2017-05-08 LAB — RHEUMATOID FACTOR

## 2017-05-08 LAB — C-REACTIVE PROTEIN: CRP: 1.5 mg/L (ref <8.0)

## 2017-05-08 LAB — TSH: TSH: 0.8 m[IU]/L (ref 0.40–4.50)

## 2017-05-08 LAB — ANA: Anti Nuclear Antibody(ANA): NEGATIVE

## 2017-05-08 NOTE — Assessment & Plan Note (Signed)
I query if his untreated anxiety and bipolar is leading to some of his symptoms Will need to get more info about his episodes

## 2017-05-08 NOTE — Telephone Encounter (Signed)
Start ibuprofen 800 mg 3 times a day #90

## 2017-05-08 NOTE — Assessment & Plan Note (Signed)
Muscle enzymes and RF checked has family history

## 2017-05-08 NOTE — Assessment & Plan Note (Signed)
?   If cardiac or neurologic cause of his episodes May need holter monitor, check labs first

## 2017-05-08 NOTE — Telephone Encounter (Signed)
Patient just left the office but called back stating that he forgot to tell Dr. Romeo AppleHarrison that the Tramadol is not helping.  He said he would like to have something else for pain  He [place] [facility] in [place], [place]

## 2017-05-08 NOTE — Progress Notes (Signed)
Patient ID: Raymond Simpson, male   DOB: 09/04/1970, 47 y.o.   MRN: [id number]   FOLLOW UP VISIT    Chief Complaint  Patient presents with  . Back Pain    lumbar  . Results    review MRI     47 year old male sent for MRI due to persistent left lower back pain with radiation into the left leg 7 out of 10 constant for over 6 months.  He was treated with gabapentin and tramadol as Ultracet but pain did not improve he says it is more difficult for him to walk pain is not improving if not getting worse  He was sent for MRI which shows foraminal stenosis L4 and 5  He comes in today for review of MRI and further treatment recommendations    Review of Systems  Gastrointestinal: Negative.   Genitourinary: Negative.   Neurological: Negative for tingling, sensory change and focal weakness.      Ortho Exam  Previous exam yielded lower lumbar spine tenderness left side increased muscle tension no deformity straight leg raise was positive   A/P  Medical decision-making  No diagnosis found.   No orders of the defined types were placed in this encounter.  MRI report  L1-2:  Negative   L2-3:  Mild disc and facet degeneration without stenosis   L3-4: Mild disc bulging and mild facet degeneration without significant stenosis.   L4-5: Diffuse disc bulging and facet degeneration. Subarticular and foraminal stenosis bilaterally. Spinal canal adequate in size. Bone marrow edema on both sides of the left L4-5 facet joint consistent with degenerative change.   L5-S1:  Mild disc and facet degeneration without stenosis   IMPRESSION: Disc and facet degeneration L4-5 contributing to subarticular and foraminal stenosis bilaterally. Mild degenerative changes elsewhere as above.     Electronically Signed   By: Marlan Palauharles  Clark M.D.   On: 04/10/2017 10:32  After review of the MRI I agree with report as dictated  Patient is opted for epidural steroid injection and we will also have  him see neurosurgery  in case the injection does not work   Raymond CanadaStanley Harrison, MD 05/08/2017 3:43 PM

## 2017-05-08 NOTE — Patient Instructions (Addendum)
Epidural Steroid Injection  An epidural steroid injection is a shot of steroid medicine and numbing medicine that is given into the space between the spinal cord and the bones in your back (epidural space). The shot helps relieve pain caused by an irritated or swollen nerve root.  The amount of pain relief you get from the injection depends on what is causing the nerve to be swollen and irritated, and how long your pain lasts. You are more likely to benefit from this injection if your pain is strong and comes on suddenly rather than if you have had pain for a long time.  Tell a health care provider about:  · Any allergies you have.  · All medicines you are taking, including vitamins, herbs, eye drops, creams, and over-the-counter medicines.  · Any problems you or family members have had with anesthetic medicines.  · Any blood disorders you have.  · Any surgeries you have had.  · Any medical conditions you have.  · Whether you are pregnant or may be pregnant.  What are the risks?  Generally, this is a safe procedure. However, problems may occur, including:  · Headache.  · Bleeding.  · Infection.  · Allergic reaction to medicines.  · Damage to your nerves.    What happens before the procedure?  Staying hydrated  Follow instructions from your health care provider about hydration, which may include:  · Up to 2 hours before the procedure - you may continue to drink clear liquids, such as water, clear fruit juice, black coffee, and plain tea.    Eating and drinking restrictions  Follow instructions from your health care provider about eating and drinking, which may include:  · 8 hours before the procedure - stop eating heavy meals or foods such as meat, fried foods, or fatty foods.  · 6 hours before the procedure - stop eating light meals or foods, such as toast or cereal.  · 6 hours before the procedure - stop drinking milk or drinks that contain milk.  · 2 hours before the procedure - stop drinking clear  liquids.    Medicine  · You may be given medicines to lower anxiety.  · Ask your health care provider about:  ? Changing or stopping your regular medicines. This is especially important if you are taking diabetes medicines or blood thinners.  ? Taking medicines such as aspirin and ibuprofen. These medicines can thin your blood. Do not take these medicines before your procedure if your health care provider instructs you not to.  General instructions  · Plan to have someone take you home from the hospital or clinic.  What happens during the procedure?  · You may receive a medicine to help you relax (sedative).  · You will be asked to lie on your abdomen.  · The injection site will be cleaned.  · A numbing medicine (local anesthetic) will be used to numb the injection site.  · A needle will be inserted through your skin into the epidural space. You may feel some discomfort when this happens. An X-ray machine will be used to make sure the needle is put as close as possible to the affected nerve.  · A steroid medicine and a local anesthetic will be injected into the epidural space.  · The needle will be removed.  · A bandage (dressing) will be put over the injection site.  What happens after the procedure?  · Your blood pressure, heart rate, breathing rate, and blood   oxygen level will be monitored until the medicines you were given have worn off.  · Your arm or leg may feel weak or numb for a few hours.  · The injection site may feel sore.  · Do not drive for 24 hours if you received a sedative.  This information is not intended to replace advice given to you by your health care provider. Make sure you discuss any questions you have with your health care provider.  Document Released: 07/03/2007 Document Revised: 09/07/2015 Document Reviewed: 07/12/2015  Elsevier Interactive Patient Education © 2018 [employer]

## 2017-05-09 ENCOUNTER — Other Ambulatory Visit: Payer: Self-pay | Admitting: Orthopedic Surgery

## 2017-05-09 DIAGNOSIS — M48061 Spinal stenosis, lumbar region without neurogenic claudication: Secondary | ICD-10-CM

## 2017-05-09 MED ORDER — IBUPROFEN 800 MG PO TABS: 800.0000 mg | ORAL_TABLET | Freq: Three times a day (TID) | ORAL | 5 refills | Status: DC | PRN

## 2017-05-09 NOTE — Telephone Encounter (Signed)
I called patient to advise.  

## 2017-05-15 ENCOUNTER — Ambulatory Visit (INDEPENDENT_AMBULATORY_CARE_PROVIDER_SITE_OTHER): Payer: BLUE CROSS/BLUE SHIELD | Admitting: Family Medicine

## 2017-05-15 ENCOUNTER — Other Ambulatory Visit: Payer: Self-pay

## 2017-05-15 ENCOUNTER — Encounter: Payer: Self-pay | Admitting: Family Medicine

## 2017-05-15 VITALS — BP 178/98 | HR 96 | Temp 98.5°F | Resp 16 | Ht 68.0 in | Wt 143.0 lb

## 2017-05-15 DIAGNOSIS — R253 Fasciculation: Secondary | ICD-10-CM

## 2017-05-15 DIAGNOSIS — I10 Essential (primary) hypertension: Secondary | ICD-10-CM

## 2017-05-15 DIAGNOSIS — R3914 Feeling of incomplete bladder emptying: Secondary | ICD-10-CM | POA: Diagnosis not present

## 2017-05-15 DIAGNOSIS — N400 Enlarged prostate without lower urinary tract symptoms: Secondary | ICD-10-CM | POA: Insufficient documentation

## 2017-05-15 DIAGNOSIS — M791 Myalgia, unspecified site: Secondary | ICD-10-CM | POA: Diagnosis not present

## 2017-05-15 DIAGNOSIS — N401 Enlarged prostate with lower urinary tract symptoms: Secondary | ICD-10-CM

## 2017-05-15 MED ORDER — TAMSULOSIN HCL 0.4 MG PO CAPS: 0.4000 mg | ORAL_CAPSULE | Freq: Every day | ORAL | 6 refills | Status: DC

## 2017-05-15 MED ORDER — LISINOPRIL 20 MG PO TABS: 20.0000 mg | ORAL_TABLET | Freq: Every day | ORAL | 3 refills | Status: DC

## 2017-05-15 MED ORDER — AMLODIPINE BESYLATE 5 MG PO TABS: 5.0000 mg | ORAL_TABLET | Freq: Every day | ORAL | 3 refills | Status: DC

## 2017-05-15 NOTE — Assessment & Plan Note (Addendum)
Start lisinopril  20 mg As he is having urinary problems will hold on diuretic ? If the syncopal episodes he had in past related to BP, or vasovagal  F/u 6 weeks

## 2017-05-15 NOTE — Patient Instructions (Signed)
Referral to neurologist  Start flomax for prostate Norvasc for blood pressure F/U 6 weeks
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
## 2017-05-15 NOTE — Assessment & Plan Note (Addendum)
Trial of Flomax , will treat his BP and see effects on his erections as hormones normal If not improvement will refer to urology

## 2017-05-16 ENCOUNTER — Telehealth: Payer: Self-pay | Admitting: Radiology

## 2017-05-16 NOTE — Telephone Encounter (Signed)
The patient has appt with Dr Conchita ParisNundkumar On 05/22/17 he is aware of the appointment

## 2017-05-29 ENCOUNTER — Ambulatory Visit: Payer: BLUE CROSS/BLUE SHIELD | Admitting: Family Medicine

## 2017-06-12 ENCOUNTER — Other Ambulatory Visit: Payer: BLUE CROSS/BLUE SHIELD

## 2017-06-19 ENCOUNTER — Ambulatory Visit
Admission: RE | Admit: 2017-06-19 | Discharge: 2017-06-19 | Disposition: A | Discharge status: Home / Self Care | Payer: BLUE CROSS/BLUE SHIELD | Source: Ambulatory Visit | Attending: Orthopedic Surgery | Admitting: Orthopedic Surgery

## 2017-06-19 DIAGNOSIS — M48061 Spinal stenosis, lumbar region without neurogenic claudication: Secondary | ICD-10-CM

## 2017-06-19 DIAGNOSIS — M47817 Spondylosis without myelopathy or radiculopathy, lumbosacral region: Secondary | ICD-10-CM | POA: Diagnosis not present

## 2017-06-19 MED ORDER — METHYLPREDNISOLONE ACETATE 40 MG/ML INJ SUSP (RADIOLOG
120.0000 mg | Freq: Once | INTRAMUSCULAR | Status: AC
  Administered 2017-06-19: 120 mg via EPIDURAL

## 2017-06-19 MED ORDER — IOPAMIDOL (ISOVUE-M 200) INJECTION 41%
1.0000 mL | Freq: Once | INTRAMUSCULAR | Status: AC
  Administered 2017-06-19: 1 mL via EPIDURAL

## 2017-06-19 NOTE — Discharge Instructions (Signed)
Post Procedure Spinal Discharge Instruction Sheet  1. You may resume a regular diet and any medications that you routinely take (including pain medications).  2. No driving day of procedure.  3. Light activity throughout the rest of the day.  Do not do any strenuous work, exercise, bending or lifting.  The day following the procedure, you can resume normal physical activity but you should refrain from exercising or physical therapy for at least three days thereafter.   Common Side Effects:   Headaches- take your usual medications as directed by your physician.  Increase your fluid intake.  Caffeinated beverages may be helpful.  Lie flat in bed until your headache resolves.   Restlessness or inability to sleep- you may have trouble sleeping for the next few days.  Ask your referring physician if you need any medication for sleep.   Facial flushing or redness- should subside within a few days.   Increased pain- a temporary increase in pain a day or two following your procedure is not unusual.  Take your pain medication as prescribed by your referring physician.   Leg cramps  Please contact our office at [phone] for the following symptoms:  Fever greater than 100 degrees.  Headaches unresolved with medication after 2-3 days.  Increased swelling, pain, or redness at injection site.  Thank you for visiting our office.    MAY RESUME GOODY'S OR BC POWDERS TODAY.

## 2017-06-26 ENCOUNTER — Ambulatory Visit: Payer: BLUE CROSS/BLUE SHIELD | Admitting: Family Medicine

## 2017-07-02 ENCOUNTER — Other Ambulatory Visit: Payer: Self-pay

## 2017-07-02 ENCOUNTER — Encounter: Payer: Self-pay | Admitting: Family Medicine

## 2017-07-02 ENCOUNTER — Ambulatory Visit (INDEPENDENT_AMBULATORY_CARE_PROVIDER_SITE_OTHER): Payer: BLUE CROSS/BLUE SHIELD | Admitting: Family Medicine

## 2017-07-02 VITALS — BP 138/94 | HR 82 | Temp 98.6°F | Resp 14 | Ht 68.0 in | Wt 143.0 lb

## 2017-07-02 DIAGNOSIS — R3914 Feeling of incomplete bladder emptying: Secondary | ICD-10-CM

## 2017-07-02 DIAGNOSIS — N401 Enlarged prostate with lower urinary tract symptoms: Secondary | ICD-10-CM

## 2017-07-02 DIAGNOSIS — M791 Myalgia, unspecified site: Secondary | ICD-10-CM | POA: Diagnosis not present

## 2017-07-02 DIAGNOSIS — I10 Essential (primary) hypertension: Secondary | ICD-10-CM

## 2017-07-02 MED ORDER — LISINOPRIL 20 MG PO TABS: 20.0000 mg | ORAL_TABLET | Freq: Every day | ORAL | 3 refills | Status: DC

## 2017-07-02 NOTE — Progress Notes (Signed)
   Subjective:    Patient ID: Raymond Simpson, male    DOB: 02/03/1971, 47 y.o.   MRN: [id number]  Patient presents for Follow-up (is not fasting)   BP - at home 143/91, has been checking yearly.  He is taking his blood pressure patient is prescribed no side effects with the medication.  He has no specific concerns today.  To have the myalgias he was given the number for neurologist they have called him multiple times.  Review Of Systems:  GEN- denies fatigue, fever, weight loss,weakness, recent illness HEENT- denies eye drainage, change in vision, nasal discharge, CVS- denies chest pain, palpitations RESP- denies SOB, cough, wheeze ABD- denies N/V, change in stools, abd pain GU- denies dysuria, hematuria, dribbling, incontinence MSK- denies joint pain, muscle aches, injury Neuro- denies headache, dizziness, syncope, seizure activity       Objective:    BP (!) 138/94   Pulse 82   Temp 98.6 F (37 C) (Oral)   Resp 14   Ht 5\' 8"  (1.727 m)   Wt 143 lb (64.9 kg)   SpO2 97%   BMI 21.74 kg/m  GEN- NAD, alert and oriented x3 HEENT- PERRL, EOMI, non injected sclera, pink conjunctiva, MMM, oropharynx clear CVS- RRR, no murmur RESP-CTAB ABD-NABS,soft,NT,ND EXT- No edema Pulses- Radial  2+        Assessment & Plan:      Problem List Items Addressed This Visit      Unprioritized   Myalgia    He is to reschedule with neurology, given information      Hypertension - Primary    BP has improved, based on previous visits, he seems quite nervouse in the office, I think this affects his BP as well Continue lisinopril at current dose, continue to monitor at home      Relevant Medications   lisinopril (PRINIVIL,ZESTRIL) 20 MG tablet   BPH (benign prostatic hyperplasia)    Discussed medication use , needed daily for symptoms He has not been taking regulary Will hold on urology until he tries the medication as prescribed         Note: This dictation was prepared with  Dragon dictation along with smaller phrase technology. Any transcriptional errors that result from this process are unintentional.

## 2017-07-02 NOTE — Patient Instructions (Addendum)
[facility]- [phone] Take the flomax daily  F/U 4 months

## 2017-07-03 ENCOUNTER — Telehealth: Payer: Self-pay | Admitting: Orthopedic Surgery

## 2017-07-03 ENCOUNTER — Other Ambulatory Visit: Payer: Self-pay | Admitting: Orthopedic Surgery

## 2017-07-03 DIAGNOSIS — M5386 Other specified dorsopathies, lumbar region: Secondary | ICD-10-CM

## 2017-07-03 DIAGNOSIS — M5126 Other intervertebral disc displacement, lumbar region: Secondary | ICD-10-CM

## 2017-07-03 MED ORDER — GABAPENTIN 100 MG PO CAPS: 100.0000 mg | ORAL_CAPSULE | Freq: Every day | ORAL | 2 refills | Status: DC

## 2017-07-03 MED ORDER — TRAMADOL-ACETAMINOPHEN 37.5-325 MG PO TABS: 1.0000 | ORAL_TABLET | ORAL | 5 refills | Status: DC | PRN

## 2017-07-03 NOTE — Telephone Encounter (Signed)
Patient called saying that the epideral injections in his back isn't working and wanted to know if Dr. Romeo AppleHarrison could prescribe him the medication that he had him on before referring him out to Dr. Conchita ParisNundkumar. Last visit with Dr. Romeo AppleHarrison was 05/08/17 for foraminal stenosis.  Please call and advise

## 2017-07-03 NOTE — Telephone Encounter (Signed)
He is asking for Tramadol, Gabapentin, Prednisone, He is waiting for surgery from Dr Conchita ParisNundkumar. I told him not to use the Prednisone if he is going to have surgery  He wants to know if you will refill Gaapentin and Tramadol (previously you gave Ultracet)

## 2017-07-03 NOTE — Telephone Encounter (Signed)
Done

## 2017-07-04 ENCOUNTER — Encounter: Payer: Self-pay | Admitting: Family Medicine

## 2017-07-04 NOTE — Assessment & Plan Note (Signed)
BP has improved, based on previous visits, he seems quite nervouse in the office, I think this affects his BP as well Continue lisinopril at current dose, continue to monitor at home

## 2017-07-04 NOTE — Assessment & Plan Note (Signed)
Discussed medication use , needed daily for symptoms He has not been taking regulary Will hold on urology until he tries the medication as prescribed

## 2017-07-04 NOTE — Assessment & Plan Note (Signed)
He is to reschedule with neurology, given information

## 2017-07-04 NOTE — Assessment & Plan Note (Signed)
Discussed medication use , needed daily for symptoms He has not been taking regulary

## 2017-09-28 ENCOUNTER — Other Ambulatory Visit: Payer: Self-pay | Admitting: Orthopedic Surgery

## 2017-09-28 DIAGNOSIS — M5386 Other specified dorsopathies, lumbar region: Secondary | ICD-10-CM

## 2017-10-01 ENCOUNTER — Other Ambulatory Visit: Payer: Self-pay | Admitting: Radiology

## 2017-10-01 DIAGNOSIS — M5386 Other specified dorsopathies, lumbar region: Secondary | ICD-10-CM

## 2017-10-01 NOTE — Telephone Encounter (Signed)
ITs 1 a day for 90 days DO U  want me to send something else, I sent 90 tablets

## 2017-10-01 NOTE — Telephone Encounter (Signed)
request received via fax for Gabapentin, you sent in 30 day supply but insurance will only cover 90 day supply   Will you send for 90d?

## 2017-10-02 MED ORDER — GABAPENTIN 100 MG PO CAPS: ORAL_CAPSULE | ORAL | 0 refills | Status: DC

## 2017-10-02 NOTE — Telephone Encounter (Signed)
You sent 30, want you to send 90 I changed prescription for you so if you want to send 90 it is ready to send

## 2017-11-01 ENCOUNTER — Ambulatory Visit: Payer: BLUE CROSS/BLUE SHIELD | Admitting: Family Medicine

## 2017-11-12 ENCOUNTER — Ambulatory Visit: Payer: BLUE CROSS/BLUE SHIELD | Admitting: Family Medicine

## 2017-11-18 ENCOUNTER — Encounter: Payer: Self-pay | Admitting: Family Medicine

## 2018-01-30 ENCOUNTER — Ambulatory Visit (INDEPENDENT_AMBULATORY_CARE_PROVIDER_SITE_OTHER): Payer: BLUE CROSS/BLUE SHIELD | Admitting: Family Medicine

## 2018-01-30 ENCOUNTER — Encounter: Payer: Self-pay | Admitting: Family Medicine

## 2018-01-30 ENCOUNTER — Other Ambulatory Visit: Payer: Self-pay

## 2018-01-30 VITALS — BP 132/86 | HR 88 | Temp 99.2°F | Resp 14 | Ht 68.0 in | Wt 146.0 lb

## 2018-01-30 DIAGNOSIS — F17200 Nicotine dependence, unspecified, uncomplicated: Secondary | ICD-10-CM

## 2018-01-30 DIAGNOSIS — R5383 Other fatigue: Secondary | ICD-10-CM

## 2018-01-30 DIAGNOSIS — N401 Enlarged prostate with lower urinary tract symptoms: Secondary | ICD-10-CM

## 2018-01-30 DIAGNOSIS — I10 Essential (primary) hypertension: Secondary | ICD-10-CM

## 2018-01-30 DIAGNOSIS — M5442 Lumbago with sciatica, left side: Secondary | ICD-10-CM | POA: Diagnosis not present

## 2018-01-30 DIAGNOSIS — R3914 Feeling of incomplete bladder emptying: Secondary | ICD-10-CM

## 2018-01-30 DIAGNOSIS — G8929 Other chronic pain: Secondary | ICD-10-CM

## 2018-01-30 MED ORDER — TAMSULOSIN HCL 0.4 MG PO CAPS: 0.4000 mg | ORAL_CAPSULE | Freq: Every day | ORAL | 1 refills | Status: DC

## 2018-01-30 MED ORDER — LISINOPRIL 10 MG PO TABS: 10.0000 mg | ORAL_TABLET | Freq: Every day | ORAL | 1 refills | Status: DC

## 2018-01-30 MED ORDER — PREGABALIN 75 MG PO CAPS
75.0000 mg | ORAL_CAPSULE | Freq: Two times a day (BID) | ORAL | 0 refills | Status: DC
Start: 2018-01-30 — End: 2018-02-20

## 2018-01-30 MED ORDER — PREDNISONE 20 MG PO TABS: 40.0000 mg | ORAL_TABLET | Freq: Every day | ORAL | 0 refills | Status: AC

## 2018-01-30 MED ORDER — HYDROCODONE-ACETAMINOPHEN 5-325 MG PO TABS: 1.0000 | ORAL_TABLET | Freq: Four times a day (QID) | ORAL | 0 refills | Status: AC | PRN

## 2018-01-30 MED ORDER — LISINOPRIL 20 MG PO TABS: 20.0000 mg | ORAL_TABLET | Freq: Every day | ORAL | 1 refills | Status: DC

## 2018-01-30 NOTE — Assessment & Plan Note (Signed)
Sciatica currently very severe, no meds for several months, he wishes to be referred back to spinal specialist  He previously took gabapentin which made him fall asleep, will do a trial of Lyrica twice daily for severe chronic pain  He has been taking a lot of NSAIDs and BC powders, asked to hold all we do a steroid burst, Tylenol and few Norco were prescribed to help him manage severe pain while steroids steroids hopefully start working  From the spinal specialist  No red flags concerning for cauda equina

## 2018-01-30 NOTE — Assessment & Plan Note (Signed)
Improved even though pt off meds for several months, has been working on lifestyle changes but still smoking. Near goal <130/80, today 132/86, asymptomatic, will rx lisinopril 10 mg and recheck in 2-4 weeks Check renal function today

## 2018-01-30 NOTE — Progress Notes (Signed)
Patient ID: Raymond Simpson, male    DOB: 08-23-1970, 47 y.o.   MRN: [id number]  PCP: Salley Scarlet, MD  Chief Complaint  Patient presents with  . Follow-up    hasbeen out of HTN meds x months  . Referral    needs referral for sciatica surgery    Subjective:   Raymond Simpson is a 47 y.o. male, presents to clinic with CC of pretension, fatigue, back pain.   Hypertension, follow-up:  BP Readings from Last 5 Encounters:  01/30/18 132/86  07/02/17 (!) 138/94  06/19/17 140/83  05/15/17 (!) 178/98  05/08/17 (!) 183/110    He was last seen for hypertension March 2019, 7 months ago, BP was improving at that time, had been very elevated, but not yet at goal, on lisinopril 20 mg.   He has been out of his medications for several months and comes in today to get a refill.  He has been working on different lifestyle changes to improve his blood pressure and he does feel he is much healthier than he was earlier this year.  He also reports that he had lost his insurance, and was out of state for a [place] months, why he ran out of medications and did not request refills. He is not exercising, secondary to acute low back pain with left sided sciatica. He is adherent to low salt diet, has cut pork out of his diet and is generally eating low fat diet, drinking more water, reports losing a few pounds and feeling much better.  He additionally believes his blood pressure is improved because he is now out of a stressful relationship. Still smoking cigarettes, 1ppd, occasional etoh 1-2 beers Outside blood pressures are not monitored He is experiencing fatigue.  Patient denies chest pain, chest pressure/discomfort, claudication, dyspnea, exertional chest pressure/discomfort, irregular heart beat, lower extremity edema, near-syncope, orthopnea, palpitations, paroxysmal nocturnal dyspnea, syncope and tachypnea.   Cardiovascular risk factors include hypertension, male gender and smoking/ tobacco  exposure.  Use of agents associated with hypertension:  BC powders and advil.  Reports he takes a lot of over-the-counter medications for his back pain  flowsheet of outpt BP: BP Readings from Last 6 Encounters:  01/30/18 132/86  07/02/17 (!) 138/94  06/19/17 140/83  05/15/17 (!) 178/98  05/08/17 (!) 183/110  05/07/17 (!) 152/88   Last Lipid Panel:    Component Value Date/Time   CHOL 212 (H) 05/07/2017 1135   TRIG 72 05/07/2017 1135   HDL 88 05/07/2017 1135   CHOLHDL 2.4 05/07/2017 1135   LDLCALC 108 (H) 05/07/2017 1135   Wt Readings from Last 3 Encounters:  01/30/18 146 lb (66.2 kg)  07/02/17 143 lb (64.9 kg)  05/15/17 143 lb (64.9 kg)   He also complains of gradual onset of generalized fatigue.  He describes it as just feeling tired with low energy all the time.  States that he will go to the park with young family members and want to go be active such as the other day they went to the parking got a paddle boat and his back pain was so severe that he had to have someone come [place] off the [place] and bring him in.  He is not as active as he used to be in though he would like to be.  He is not sure if this is related to his severe and worsening low back and sciatic pain.  He reports he sleeping well, only wakes up at night  to use the restroom few times but his pain is not waking him up at night.  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  He denies any exertional shortness of breath, chest pain, near syncopal episodes, dizziness, unintentional weight loss, pallor, abdominal pain.  He reports that in taking a lot of over-the-counter medicines for back pain he has not had any GI upset, reflux, indigestion, melena, hematochezia.  No hair or skin changes, he previously had some constipation but his bowels are regular and better now.  Urinary sx up at nigth 2-3x, gotten better than it was before.  He does  still have some difficulty initiating his urine stream, currently no dysuria, hematuria.  Worsening chronic low back pain and left sided sciatica x 1.5 years, lost insurance last Dec when he was preparing for surgery, getting ESI, went to spinal specialist -  Needs referral again spinal specialist He reports the pain is fairly constant, located in his left lower back with radiation to his buttocks and down his entire left leg.  Pain is severe, described as "unbearable, pain sharp and pressure and leg feels like it weighs 200 lbs" is exacerbated by long periods of sitting, or by going up and down steps or going from sitting to standing.  Over-the-counter medicines and laying flat at night minimally relieve it.  He denies any numbness or tingling in the leg denies any weakness in his leg.  He also denies any incontinence of stool or urine, saddle anesthesia.      HPI      Patient Active Problem List   Diagnosis Date Noted  . Hypertension 05/15/2017  . BPH (benign prostatic hyperplasia) 05/15/2017  . Generalized joint pain 05/07/2017  . Myalgia 05/07/2017  . Syncopal episodes 05/07/2017  . GAD (generalized anxiety disorder) 05/07/2017  . Tinea versicolor 05/07/2017     Prior to Admission medications   Medication Sig Start Date End Date Taking? Authorizing Provider  gabapentin (NEURONTIN) 100 MG capsule TAKE 1 CAPSULE(100 MG) BY MOUTH AT BEDTIME Patient not taking: Reported on 01/30/2018 10/02/17   Vickki Hearing, MD  lisinopril (PRINIVIL,ZESTRIL) 20 MG tablet Take 1 tablet (20 mg total) by mouth daily. 01/30/18   Danelle Berry, PA-C  Multiple Vitamin (MULTIVITAMIN WITH MINERALS) TABS Take 1 tablet by mouth daily. MEGA MENS MULTIVITAMIN    [provider]  OVER THE COUNTER MEDICATION Nugenix    [provider]  selenium sulfide (SELSUN) 2.5 % shampoo Apply as prescribed daily to affected areas on skin Patient not taking: Reported on 01/30/2018 05/07/17   Salley Scarlet, MD  tamsulosin (FLOMAX) 0.4 MG CAPS capsule Take 1 capsule (0.4 mg total) by mouth daily after supper. For prostate 01/30/18   Danelle Berry, PA-C  traMADol-acetaminophen (ULTRACET) 37.5-325 MG tablet Take 1 tablet by mouth every 4 (four) hours as needed. Patient not taking: Reported on 01/30/2018 07/03/17   Vickki Hearing, MD     No Known Allergies   Family History  Problem Relation Age of Onset  . ALS Mother   . Arthritis Mother   . Vision loss Mother   . Alcohol abuse Father   . Drug abuse Father   . Vision loss Father   . Arthritis Maternal Grandmother   . Cancer Maternal Grandmother        pancreatic  . Arthritis Maternal Grandfather   . Alcohol abuse Paternal Grandmother   .  Alcohol abuse Paternal Grandfather      Social History   Socioeconomic History  . Marital status: Significant Other    Spouse name: Not on file  . Number of children: 2  . Years of education: Not on file  . Highest education level: High school graduate  Occupational History  . 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 Financial resource strain: Not hard at all  . Food insecurity:    Worry: Never true    Inability: Never true  . Transportation needs:    Medical: No    Non-medical: No  Tobacco Use  . Smoking status: Current Every Day Smoker    Types: Cigarettes  . Smokeless tobacco: Never Used  Substance and Sexual Activity  . Alcohol use: No    Frequency: Never    Comment: Occ  . Drug use: No  . Sexual activity: Yes    Birth control/protection: None  Lifestyle  . Physical activity:    Days per week: 2 days    Minutes per session: 30 min  . Stress: Not at all  Relationships  . Social connections:    Talks on phone: More than three times a week    Gets together: Twice a week    Attends religious service: More than 4 times per year    Active member of club or organization: No    Attends meetings of  clubs or organizations: Never    Relationship status: Living with partner  . Intimate partner violence:    Fear of current or ex partner: No    Emotionally abused: No    Physically abused: No    Forced sexual activity: No  Other Topics Concern  . Not on file  Social History Narrative  . Not on file     Review of Systems  Constitutional: Positive for fatigue. Negative for activity change, appetite change, chills, diaphoresis, fever and unexpected weight change.  HENT: Negative.   Eyes: Negative.   Respiratory: Negative.  Negative for apnea, cough, choking, chest tightness, shortness of breath, wheezing and stridor.   Cardiovascular: Negative.  Negative for chest pain, palpitations and leg swelling.  Gastrointestinal: Negative.  Negative for abdominal distention, abdominal pain, anal bleeding, blood in stool, constipation, diarrhea, nausea and vomiting.  Endocrine: Negative.  Negative for cold intolerance, heat intolerance, polydipsia, polyphagia and polyuria.  Genitourinary: Negative for decreased urine volume, discharge, enuresis, flank pain, genital sores, hematuria, penile pain, penile swelling, scrotal swelling, testicular pain and urgency.  Musculoskeletal: Positive for back pain. Negative for arthralgias, joint swelling, myalgias, neck pain and neck stiffness.  Skin: Negative.  Negative for color change, pallor and rash.  Allergic/Immunologic: Negative.  Negative for immunocompromised state.  Neurological: Negative.  Negative for dizziness, tremors, syncope, facial asymmetry, weakness, light-headedness, numbness and headaches.  Hematological: Negative.  Negative for adenopathy. Does not bruise/bleed easily.  Psychiatric/Behavioral: Negative.   All other systems reviewed and are negative.      Objective:    Vitals:   01/30/18 1201  BP: 132/86  Pulse: 88  Resp: 14  Temp: 99.2 F (37.3 C)  TempSrc: Oral  SpO2: 100%  Weight: 146 lb (66.2 kg)  Height: 5\' 8"  (1.727 m)        Physical Exam  Constitutional: He is oriented to person, place, and time. He appears well-developed and well-nourished.  Non-toxic appearance. He does not have a sickly appearance. He does not appear  ill. No distress.  HENT:  Head: Normocephalic and atraumatic.  Right Ear: External ear and ear canal normal.  Left Ear: External ear and ear canal normal.  Nose: No mucosal edema or rhinorrhea.  Mouth/Throat: Uvula is midline and mucous membranes are normal. Mucous membranes are not pale, not dry and not cyanotic. No trismus in the jaw. No uvula swelling. No oropharyngeal exudate, posterior oropharyngeal edema or posterior oropharyngeal erythema.  Eyes: Pupils are equal, round, and reactive to light. Conjunctivae, EOM and lids are normal.  Neck: Trachea normal, normal range of motion, full passive range of motion without pain and phonation normal. Neck supple. No spinous process tenderness present. No neck rigidity. No tracheal deviation and normal range of motion present. No thyromegaly present.  Cardiovascular: Regular rhythm, intact distal pulses and normal pulses.  No extrasystoles are present. Tachycardia present. PMI is not displaced. Exam reveals distant heart sounds. Exam reveals no gallop and no friction rub.  No murmur heard. Pulses:      Radial pulses are 2+ on the right side, and 2+ on the left side.       Posterior tibial pulses are 2+ on the right side, and 2+ on the left side.  No lower extremity edema Patient grimacing with pain at the time of cardiac exam  Pulmonary/Chest: Effort normal and breath sounds normal. No accessory muscle usage or stridor. No tachypnea. No respiratory distress. He has no decreased breath sounds. He has no wheezes. He has no rhonchi. He has no rales.  Abdominal: Soft. Normal appearance and bowel sounds are normal. He exhibits no distension. There is no tenderness. There is no rigidity, no rebound and no guarding.  Musculoskeletal: He exhibits no edema.        Lumbar back: He exhibits decreased range of motion and tenderness. He exhibits no deformity and no spasm.  Positive straight leg raise on the left  Neurological: He is alert and oriented to person, place, and time. He displays no tremor. No sensory deficit. He exhibits normal muscle tone. Coordination and gait normal.  5/5 strength to b/l LE with dorsiflexion and plantar flexion Grossly normal b/l LE sensation to light touch  Skin: Skin is warm, dry and intact. Capillary refill takes less than 2 seconds. No rash noted.  Psychiatric: He has a normal mood and affect. His speech is normal and behavior is normal.          Assessment & Plan:    Problem List Items Addressed This Visit      Cardiovascular and Mediastinum   Hypertension - Primary    Improved even though pt off meds for several months, has been working on lifestyle changes but still smoking. Near goal <130/80, today 132/86, asymptomatic, will rx lisinopril 10 mg and recheck in 2-4 weeks Check renal function today      Relevant Medications   lisinopril (PRINIVIL,ZESTRIL) 10 MG tablet   Other Relevant Orders   BASIC METABOLIC PANEL WITH GFR     Nervous and Auditory   Chronic left-sided low back pain with left-sided sciatica    Sciatica currently very severe, no meds for several months, he wishes to be referred back to spinal specialist  He previously took gabapentin which made him fall asleep, will do a trial of Lyrica twice daily for severe chronic pain  He has been taking a lot of NSAIDs and BC powders, asked to hold all we do a steroid burst, Tylenol and few Norco were prescribed to help him manage severe  pain while steroids steroids hopefully start working  From the spinal specialist  No red flags concerning for cauda equina      Relevant Medications   pregabalin (LYRICA) 75 MG capsule   predniSONE (DELTASONE) 20 MG tablet   HYDROcodone-acetaminophen (NORCO/VICODIN) 5-325 MG tablet     Genitourinary    BPH (benign prostatic hyperplasia)    Still having LUTS, but improved, would like to try flomax again, advised to take at night, watch for lightheadedness, reflexive tachycardia or near syncopal episodes possible side effects        Relevant Medications   tamsulosin (FLOMAX) 0.4 MG CAPS capsule     Other   Current smoker    Briefly advised on dangers of smoking, patient does not currently wish to smoke, offered smoking cessation when he is ready       Other Visit Diagnoses    Fatigue, unspecified type       Relevant Orders   CBC with Differential   TSH        Fatigue, unspecified type -feel his fatigue may be secondary to chronic pain, he has no other focal symptoms concerning for any illness, cardiac component, no concerning weight loss.  On exam it is not look pale and he has no other symptoms of anemia but with his high intake of NSAIDs and BC powders will screen with CBC to rule out anemia as a cause of fatigue, will screen TSH.  Discussed his symptoms with him at length and possible work-up with him at length, and we both agreed to try and treat his severe pain first, do these few screening test, and if he does not improve or there are no identifiable etiologies of fatigue, then he will come back for more extensive work-up.    Danelle Berry, PA-C 01/30/18 12:20 PM

## 2018-01-30 NOTE — Assessment & Plan Note (Signed)
Briefly advised on dangers of smoking, patient does not currently wish to smoke, offered smoking cessation when he is ready

## 2018-01-30 NOTE — Patient Instructions (Signed)
Monitor your blood pressure, start 10 mg lisinopril and follow up with [place] in 2-4 weeks to recheck this.  For back pain and sciatica -I will put through referral seeking get back to her spinal specialist.  Start the steroid prescription today and stop taking any BC powders or NSAIDs which are Aleve, naproxen, ibuprofen.  Can take Tylenol and the prescribed pain medication to help you get relief over the next couple days while the steroids kick in.  Flomax as prescribed to give you relief of your urinary symptoms related to your enlarged prostate.  Try the Lyrica prescription which is for chronic pain and shooting nerve pain such as sciatica.  This may help your pain throughout the day and also may help your fatigue and energy complaints.  If you find that your fatigue is not changed at all with improved pain management, please return for a follow-up office visit, there is much more we could investigate and work-up with fatigue because there are so many things that could be causing the symptoms.

## 2018-01-30 NOTE — Assessment & Plan Note (Signed)
Still having LUTS, but improved, would like to try flomax again, advised to take at night, watch for lightheadedness, reflexive tachycardia or near syncopal episodes possible side effects

## 2018-01-31 ENCOUNTER — Encounter: Payer: Self-pay | Admitting: *Deleted

## 2018-01-31 LAB — CBC WITH DIFFERENTIAL/PLATELET
BASOS PCT: 0.8 %
Basophils Absolute: 59 cells/uL (ref 0–200)
EOS PCT: 9.2 %
Eosinophils Absolute: 681 cells/uL — ABNORMAL HIGH (ref 15–500)
HCT: 40.6 % (ref 38.5–50.0)
Hemoglobin: 13.7 g/dL (ref 13.2–17.1)
Lymphs Abs: 2612 cells/uL (ref 850–3900)
MCH: 31.2 pg (ref 27.0–33.0)
MCHC: 33.7 g/dL (ref 32.0–36.0)
MCV: 92.5 fL (ref 80.0–100.0)
MPV: 10.9 fL (ref 7.5–12.5)
Monocytes Relative: 6.7 %
NEUTROS ABS: 3552 {cells}/uL (ref 1500–7800)
NEUTROS PCT: 48 %
PLATELETS: 264 10*3/uL (ref 140–400)
RBC: 4.39 10*6/uL (ref 4.20–5.80)
RDW: 11.5 % (ref 11.0–15.0)
TOTAL LYMPHOCYTE: 35.3 %
WBC: 7.4 10*3/uL (ref 3.8–10.8)
WBCMIX: 496 {cells}/uL (ref 200–950)

## 2018-01-31 LAB — TSH: TSH: 0.66 m[IU]/L (ref 0.40–4.50)

## 2018-01-31 LAB — BASIC METABOLIC PANEL WITH GFR
BUN: 9 mg/dL (ref 7–25)
CO2: 29 mmol/L (ref 20–32)
CREATININE: 1.27 mg/dL (ref 0.60–1.35)
Calcium: 9.5 mg/dL (ref 8.6–10.3)
Chloride: 98 mmol/L (ref 98–110)
GFR, EST AFRICAN AMERICAN: 77 mL/min/{1.73_m2} (ref >=60)
GFR, EST NON AFRICAN AMERICAN: 66 mL/min/{1.73_m2} (ref >=60)
Glucose, Bld: 87 mg/dL (ref 65–99)
Potassium: 4.2 mmol/L (ref 3.5–5.3)
SODIUM: 138 mmol/L (ref 135–146)

## 2018-02-03 ENCOUNTER — Telehealth: Payer: Self-pay | Admitting: *Deleted

## 2018-02-03 NOTE — Telephone Encounter (Signed)
Received call from patient.   Reports that he has tried Hydrocodone/ APAP for pain, but it was not effective. States that he has resumed using BC powders since they seem to be more  Effective than pain medications.   Requested PA to advise if another medication could be more beneficial.

## 2018-02-03 NOTE — Telephone Encounter (Signed)
He has been warned of bleeding risk with BC powders if they have ASA, and also been warned to not take NSAIDs while taking the steroids.  We could try other pain medications, but really he needs PT and/or the spine specialists to eval him/tx him.

## 2018-02-05 NOTE — Telephone Encounter (Signed)
Is he taking lyrica daily?   It is a daily medicine to help tx chronic pain, we can increase the dose if he is tolerating that.  Would increase to 150 mg lyrica at bedtime and 75 mg PO Q am. He has to come back into clinic before prescribing any more narcotic pain medication, new laws that limit amount of days of Rx for controlled substances.  Risk very high and usually ineffective tx of pain, especially chronic pain.   Where are we with getting him into specialists?

## 2018-02-05 NOTE — Telephone Encounter (Signed)
Spoke with patient and informed him of the risk of BC Powders and taking Nsaids. He verbalized understanding and stated that he is ok with trying any other medication that you recommend because he needs some relief. Please advise?

## 2018-02-06 NOTE — Telephone Encounter (Signed)
Spoke with patient and he stated he tolerating the Lyrica but he would like to come in to discuss other options. [facility] received the referral but they need patient to call and speak with the insurance and billing department to discuss previous balance before they will be able to schedule him. I gave patient the number to their office and he stated that he will call them. He has an appointment with you on 02/11/18

## 2018-02-11 ENCOUNTER — Encounter: Payer: Self-pay | Admitting: Family Medicine

## 2018-02-11 ENCOUNTER — Ambulatory Visit (INDEPENDENT_AMBULATORY_CARE_PROVIDER_SITE_OTHER): Payer: BLUE CROSS/BLUE SHIELD | Admitting: Family Medicine

## 2018-02-11 ENCOUNTER — Other Ambulatory Visit: Payer: Self-pay

## 2018-02-11 VITALS — BP 130/74 | HR 78 | Temp 98.9°F | Resp 16 | Ht 68.0 in | Wt 144.0 lb

## 2018-02-11 DIAGNOSIS — G8929 Other chronic pain: Secondary | ICD-10-CM | POA: Diagnosis not present

## 2018-02-11 DIAGNOSIS — K219 Gastro-esophageal reflux disease without esophagitis: Secondary | ICD-10-CM | POA: Diagnosis not present

## 2018-02-11 DIAGNOSIS — M5442 Lumbago with sciatica, left side: Secondary | ICD-10-CM | POA: Diagnosis not present

## 2018-02-11 DIAGNOSIS — Z760 Encounter for issue of repeat prescription: Secondary | ICD-10-CM

## 2018-02-11 MED ORDER — SELENIUM SULFIDE 2.5 % EX LOTN
TOPICAL_LOTION | CUTANEOUS | 12 refills | Status: AC
Start: 2018-02-11 — End: ?

## 2018-02-11 MED ORDER — TRIAMCINOLONE ACETONIDE 0.025 % EX OINT: 1.0000 "application " | TOPICAL_OINTMENT | Freq: Two times a day (BID) | CUTANEOUS | 0 refills | Status: AC | PRN

## 2018-02-11 MED ORDER — OMEPRAZOLE 40 MG PO CPDR: 40.0000 mg | DELAYED_RELEASE_CAPSULE | Freq: Every day | ORAL | 3 refills | Status: DC

## 2018-02-11 MED ORDER — HYDROCODONE-ACETAMINOPHEN 5-325 MG PO TABS: 1.0000 | ORAL_TABLET | Freq: Two times a day (BID) | ORAL | 0 refills | Status: DC | PRN

## 2018-02-11 NOTE — Patient Instructions (Signed)
Start taking omeprazole on an empty stomach, do not eat for 1 hour afterwards, check with your pharmacist before taking it at the same time as other medications.  This will start to protect your stomach lining from GERD and acid reflux.  The other recommendations and instructions printed below.  As you are not having stomach irritation you can use over-the-counter medicines such as Advil, Aleve, naproxen which are very effective for muscle skeletal inflammation and pain.    I would just be cautious with BC powders and check ingredients to make sure you are not taking too much NSAIDs or if it has aspirin in it watch for signs of bleeding including bruises, bleeding from your gums, dark stools.    Start doing physical therapy, use pain meds very sparingly -the current dose does need to last you until next year  I will add muscle relaxers for stiffness and pain at night and first thing in the morning  We will also increase her Lyrica dose at night  Continue her blood pressure medication and follow-up with your PCP in 4 months   Chronic Back Pain When back pain lasts longer than 3 months, it is called chronic back pain.The cause of your back pain may not be known. Some common causes include:  Wear and tear (degenerative disease) of the bones, ligaments, or disks in your back.  Inflammation and stiffness in your back (arthritis).  People who have chronic back pain often go through certain periods in which the pain is more intense (flare-ups). Many people can learn to manage the pain with home care. Follow these instructions at home: Pay attention to any changes in your symptoms. Take these actions to help with your pain: Activity  Avoid bending and activities that make the problem worse.  Do not sit or stand in one place for long periods of time.  Take brief periods of rest throughout the day. This will reduce your pain. Resting in a lying or standing position is usually better than  sitting to rest.  When you are resting for longer periods, mix in some mild activity or stretching between periods of rest. This will help to prevent stiffness and pain.  Get regular exercise. Ask your health care provider what activities are safe for you.  Do not lift anything that is heavier than 10 lb (4.5 kg). Always use proper lifting technique, which includes: ? Bending your knees. ? Keeping the load close to your body. ? Avoiding twisting. Managing pain  If directed, apply ice to the painful area. Your health care provider may recommend applying ice during the first 24-48 hours after a flare-up begins. ? Put ice in a plastic bag. ? Place a towel between your skin and the bag. ? Leave the ice on for 20 minutes, 2-3 times per day.  After icing, apply heat to the affected area as often as told by your health care provider. Use the heat source that your health care provider recommends, such as a moist heat pack or a heating pad. ? Place a towel between your skin and the heat source. ? Leave the heat on for 20-30 minutes. ? Remove the heat if your skin turns bright red. This is especially important if you are unable to feel pain, heat, or cold. You may have a greater risk of getting burned.  Try soaking in a warm tub.  Take over-the-counter and prescription medicines only as told by your health care provider.  Keep all follow-up visits as told  by your health care provider. This is important. Contact a health care provider if:  You have pain that is not relieved with rest or medicine. Get help right away if:  You have weakness or numbness in one or both of your legs or feet.  You have trouble controlling your bladder or your bowels.  You have nausea or vomiting.  You have pain in your abdomen.  You have shortness of breath or you faint. This information is not intended to replace advice given to you by your health care provider. Make sure you discuss any questions you have  with your health care provider. Document Released: 05/03/2004 Document Revised: 08/04/2015 Document Reviewed: 09/13/2014 Elsevier Interactive Patient Education  2018 [employer]   Gastroesophageal Reflux Disease, Adult Normally, food travels down the esophagus and stays in the stomach to be digested. If a person has gastroesophageal reflux disease (GERD), food and stomach acid move back up into the esophagus. When this happens, the esophagus becomes sore and swollen (inflamed). Over time, GERD can make small holes (ulcers) in the lining of the esophagus. Follow these instructions at home: Diet  Follow a diet as told by your doctor. You may need to avoid foods and drinks such as: ? Coffee and tea (with or without caffeine). ? Drinks that contain alcohol. ? Energy drinks and sports drinks. ? Carbonated drinks or sodas. ? Chocolate and cocoa. ? Peppermint and mint flavorings. ? Garlic and onions. ? Horseradish. ? Spicy and acidic foods, such as peppers, chili powder, curry powder, vinegar, hot sauces, and BBQ sauce. ? Citrus fruit juices and citrus fruits, such as oranges, lemons, and limes. ? Tomato-based foods, such as red sauce, chili, salsa, and pizza with red sauce. ? Fried and fatty foods, such as donuts, french fries, potato chips, and high-fat dressings. ? High-fat meats, such as hot dogs, rib eye steak, sausage, ham, and bacon. ? High-fat dairy items, such as whole milk, butter, and cream cheese.  Eat small meals often. Avoid eating large meals.  Avoid drinking large amounts of liquid with your meals.  Avoid eating meals during the 2-3 hours before bedtime.  Avoid lying down right after you eat.  Do not exercise right after you eat. General instructions  Pay attention to any changes in your symptoms.  Take over-the-counter and prescription medicines only as told by your doctor. Do not take aspirin, ibuprofen, or other NSAIDs unless your doctor says it is okay.  Do  not use any tobacco products, including cigarettes, chewing tobacco, and e-cigarettes. If you need help quitting, ask your doctor.  Wear loose clothes. Do not wear anything tight around your waist.  Raise (elevate) the head of your bed about 6 inches (15 cm).  Try to lower your stress. If you need help doing this, ask your doctor.  If you are overweight, lose an amount of weight that is healthy for you. Ask your doctor about a safe weight loss goal.  Keep all follow-up visits as told by your doctor. This is important. Contact a doctor if:  You have new symptoms.  You lose weight and you do not know why it is happening.  You have trouble swallowing, or it hurts to swallow.  You have wheezing or a cough that keeps happening.  Your symptoms do not get better with treatment.  You have a hoarse voice. Get help right away if:  You have pain in your arms, neck, jaw, teeth, or back.  You feel sweaty, dizzy, or light-headed.  You have chest pain or shortness of breath.  You throw up (vomit) and your throw up looks like blood or coffee grounds.  You pass out (faint).  Your poop (stool) is bloody or black.  You cannot swallow, drink, or eat. This information is not intended to replace advice given to you by your health care provider. Make sure you discuss any questions you have with your health care provider. Document Released: 09/12/2007 Document Revised: 09/01/2015 Document Reviewed: 07/21/2014 Elsevier Interactive Patient Education  [employer].

## 2018-02-11 NOTE — Progress Notes (Addendum)
Patient ID: Raymond Simpson, male    DOB: [birth year], 47 y.o.   MRN: [id number]  PCP: Salley Scarlet, MD  Chief Complaint  Patient presents with  . Follow-up    sciatic nerve pain    Subjective:   Raymond Simpson is a 47 y.o. male, presents to clinic with CC of chronic back pain with sciatica, here for recheck and for refill of medications.  States that he did have some mild improvement although no resolution of his chronic back pain with sciatica.  He did have a little bit of stomach upset with oral steroids but it did seem to help his back pain decreased the intensity, severity and frequency of shooting radiating pain down his left low back into his left leg.  He is also tolerating lyrica and believes this has helped with nerve pain, leg sx and he is sleeping better.   He continues to do Tylenol but he did avoid completely BC powders and NSAIDs except for one occassion.  Since discussing the medications he is extremely anxious about having a GI bleed.  He only had one instance where he had a little indigestion and irritation with his stomach and this was immediately after drinking a soda.  He has had no vomiting no constant epigastric pain, no melena or hematochezia.  He did take the Norco which did help temporarily with his pain.  He states that after taking it he would feel great and he would go to yard work or other activities with his family and then afterwards would feel much worse.  Pain today is in the same location, left low back, still continuous, less severe than last appointment, today pain is rated mild to moderate.  He continues to deny any focal weakness, paresthesias, urinary incontinence or stool incontinence.    He did contact the spinal specialist and he was advised to do procedure after the first of the year.   01/30/18 HPI: Worsening chronic low back pain and left sided sciatica x 1.5 years, lost insurance last Dec when he was preparing for surgery, getting ESI, went  to spinal specialist -  Needs referral again spinal specialist He reports the pain is fairly constant, located in his left lower back with radiation to his buttocks and down his entire left leg.  Pain is severe, described as "unbearable, pain sharp and pressure and leg feels like it weighs 200 lbs" is exacerbated by long periods of sitting, or by going up and down steps or going from sitting to standing.  Over-the-counter medicines and laying flat at night minimally relieve it.  He denies any numbness or tingling in the leg denies any weakness in his leg.  He also denies any incontinence of stool or urine, saddle anesthesia.  Patient Active Problem List   Diagnosis Date Noted  . Current smoker 01/30/2018  . Chronic left-sided low back pain with left-sided sciatica 01/30/2018  . Hypertension 05/15/2017  . BPH (benign prostatic hyperplasia) 05/15/2017  . Generalized joint pain 05/07/2017  . Myalgia 05/07/2017  . Syncopal episodes 05/07/2017  . GAD (generalized anxiety disorder) 05/07/2017  . Tinea versicolor 05/07/2017     Prior to Admission medications   Medication Sig Start Date End Date Taking? Authorizing Provider  lisinopril (PRINIVIL,ZESTRIL) 10 MG tablet Take 1 tablet (10 mg total) by mouth daily. 01/30/18  Yes Danelle Berry, PA-C  Multiple Vitamin (MULTIVITAMIN WITH MINERALS) TABS Take 1 tablet by mouth daily. MEGA MENS MULTIVITAMIN   Yes [provider]  OVER THE COUNTER MEDICATION Nugenix   Yes [provider]  pregabalin (LYRICA) 75 MG capsule Take 1 capsule (75 mg total) by mouth 2 (two) times daily. 01/30/18  Yes Danelle Berry, PA-C  selenium sulfide (SELSUN) 2.5 % shampoo Apply as prescribed daily to affected areas on skin 05/07/17  Yes Yutan, Velna Hatchet, MD  tamsulosin (FLOMAX) 0.4 MG CAPS capsule Take 1 capsule (0.4 mg total) by mouth daily after supper. For prostate 01/30/18  Yes Danelle Berry, PA-C     No Known Allergies   Family History  Problem Relation  Age of Onset  . ALS Mother   . Arthritis Mother   . Vision loss Mother   . Alcohol abuse Father   . Drug abuse Father   . Vision loss Father   . Arthritis Maternal Grandmother   . Cancer Maternal Grandmother        pancreatic  . Arthritis Maternal Grandfather   . Alcohol abuse Paternal Grandmother   . Alcohol abuse Paternal Grandfather      Social History   Socioeconomic History  . Marital status: Significant Other    Spouse name: Not on file  . Number of children: 2  . Years of education: Not on file  . Highest education level: High school graduate  Occupational History  . 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 Financial resource strain: Not hard at all  . Food insecurity:    Worry: Never true    Inability: Never true  . Transportation needs:    Medical: No    Non-medical: No  Tobacco Use  . Smoking status: Current Every Day Smoker    Types: Cigarettes  . Smokeless tobacco: Never Used  Substance and Sexual Activity  . Alcohol use: No    Frequency: Never    Comment: Occ  . Drug use: No  . Sexual activity: Yes    Birth control/protection: None  Lifestyle  . Physical activity:    Days per week: 2 days    Minutes per session: 30 min  . Stress: Not at all  Relationships  . Social connections:    Talks on phone: More than three times a week    Gets together: Twice a week    Attends religious service: More than 4 times per year    Active member of club or organization: No    Attends meetings of clubs or organizations: Never    Relationship status: Living with partner  . Intimate partner violence:    Fear of current or ex partner: No    Emotionally abused: No    Physically abused: No    Forced sexual activity: No  Other Topics Concern  . Not on file  Social History Narrative  . Not on file     Review of Systems  Constitutional: Negative.   HENT: Negative.   Eyes: Negative.     Respiratory: Negative.   Cardiovascular: Negative.   Gastrointestinal: Negative.   Endocrine: Negative.   Genitourinary: Negative.   Musculoskeletal: Negative.   Skin: Negative.   Allergic/Immunologic: Negative.   Neurological: Negative.   Hematological: Negative.   Psychiatric/Behavioral: Negative.   All other systems reviewed and are negative.      Objective:    Vitals:   02/11/18 1209  BP: 130/74  Pulse: 78  Resp: 16  Temp: 98.9 F (37.2 C)  TempSrc: Oral  SpO2: 98%  Weight:  144 lb (65.3 kg)  Height: 5\' 8"  (1.727 m)      Physical Exam  Constitutional: He appears well-developed and well-nourished. No distress.  HENT:  Head: Normocephalic and atraumatic.  Nose: Nose normal.  Mouth/Throat: Oropharynx is clear and moist. No oropharyngeal exudate.  Eyes: Pupils are equal, round, and reactive to light. Conjunctivae are normal. Right eye exhibits no discharge. Left eye exhibits no discharge. No scleral icterus.  Neck: Normal range of motion. Neck supple. No tracheal deviation present.  Cardiovascular: Normal rate, regular rhythm, normal heart sounds and intact distal pulses. Exam reveals no gallop and no friction rub.  No murmur heard. Pulses:      Dorsalis pedis pulses are 2+ on the right side, and 2+ on the left side.       Posterior tibial pulses are 2+ on the right side, and 2+ on the left side.  Pulmonary/Chest: Effort normal and breath sounds normal. No stridor. No respiratory distress. He has no wheezes. He has no rales. He exhibits no tenderness.  Abdominal: Soft. Normal appearance and bowel sounds are normal. He exhibits no distension and no mass. There is no tenderness. There is no rebound, no guarding and no CVA tenderness.  Musculoskeletal: Normal range of motion. He exhibits no edema, tenderness or deformity.  Good range of motion of low back, able to move around exam room, sit and stand without difficulty, no midline spinal tenderness from cervical to  lumbar spine, no step-off  Neurological: He is alert. He has normal strength. He displays no tremor. No sensory deficit. He exhibits normal muscle tone. Coordination and gait normal.  5 out of 5 strength to bilateral lower extremities with dorsiflexion plantarflexion, grossly intact sensation to light touch to bilateral lower extremities  Skin: Skin is warm and dry. Capillary refill takes less than 2 seconds. No rash noted. He is not diaphoretic. No cyanosis. No pallor. Nails show no clubbing.  Psychiatric: His behavior is normal.  Lightly anxious  Nursing note and vitals reviewed.         Assessment & Plan:   Patient is a 47 year old male with chronic left low back pain with sciatic features, he is going to establish with spinal specialist but was advised to do this the beginning of next year for insurance deductible purposes.  There is also notes of a prior balance that may be an issue.  Patient did have some improvement of his chronic pain with steroid burst that was given to him about a week and a half ago.  He also did use narcotic pain medicine which was given to him with a 5-day supply, precautions thoroughly explained at that time and reiterated today, about addictive nature, limitations of long-term pain management, risk of dependency, risk of respiratory depression or overdose.  Patient did use the narcotic pain medicine inappropriately by doubling his pain and then overexerting himself causing himself more pain in the end, he does seem receptive to this is we discussed that he should only use the pain when he is in severe debilitating pain that causes restricted movement or really impairs his life or his sleep, it is not to be used to allow him to do gardening or yard work.    Patient did have very slight epigastric symptoms but he has no tenderness on exam, he has no history of GERD, gastric ulcers or GI bleed.  I had previously educated him on being careful with BC powders, especially  those containing aspirin, with current use of NSAIDs  and also to avoid NSAIDs at the same time is using steroids because it would be hard on his stomach lining.  I do suggest starting omeprazole at this time to protect his stomach because with chronic back pain he may need to be able to take NSAIDs daily to get some relief and to better manage his pain without the risk of narcotic dependency or chronic narcotic use for pain management.  Will refer to PT  Norco was refilled for chronic left lower back pain with sciatica, he was given 30 pills that need to last him for the next 2 months, so he was encouraged to use very sparingly.  He verbalizes understanding about the precautions with narcotic pain medicine, he verbalizes understanding of proper and indicated to use.  He agrees to start omeprazole.  And plan is to follow-up with specialist.  He does ask for refills on past medicines on his chart by his PCP for rash, this was refilled for him.  Chronic left-sided low back pain with left-sided sciatica -  Plan:   HYDROcodone-acetaminophen (NORCO/VICODIN) 5-325 MG tablet - sparingly for severe pain  Increase lyrica to 150 mg PO at bedtime and continue 75 mg PO q am  Tylenol prn, OTC aleve or naproxen as tolerated   Ambulatory referral to Physical Therapy  Gastroesophageal reflux disease, esophagitis presence not specified -  start tx with omeprazole  40 MG capsule - proper administration reviewed  Medication refills:  selenium sulfide (SELSUN) 2.5 % shampoo triamcinolone (KENALOG) 0.025 % ointment  BP - started meds last week, no SE, continue meds, f/up in 3-4 months with PCP for recheck BP and BMP  Danelle Berry, PA-C 02/11/18 12:23 PM

## 2018-02-20 ENCOUNTER — Ambulatory Visit (HOSPITAL_COMMUNITY): Payer: BLUE CROSS/BLUE SHIELD | Attending: Physical Therapy | Admitting: Physical Therapy

## 2018-02-20 MED ORDER — PREGABALIN 75 MG PO CAPS: ORAL_CAPSULE | ORAL | 0 refills | Status: DC

## 2018-02-20 MED ORDER — METHOCARBAMOL 750 MG PO TABS: 750.0000 mg | ORAL_TABLET | Freq: Three times a day (TID) | ORAL | 0 refills | Status: DC | PRN

## 2018-02-20 NOTE — Addendum Note (Signed)
Addended by: Danelle BerryAPIA, Anisia Leija on: 02/20/2018 11:22 PM   Modules accepted: Orders

## 2018-03-26 ENCOUNTER — Encounter: Payer: Self-pay | Admitting: Family Medicine

## 2018-05-09 ENCOUNTER — Other Ambulatory Visit: Payer: Self-pay | Admitting: Family Medicine

## 2018-05-09 DIAGNOSIS — M5442 Lumbago with sciatica, left side: Principal | ICD-10-CM

## 2018-05-09 DIAGNOSIS — G8929 Other chronic pain: Secondary | ICD-10-CM

## 2018-05-09 NOTE — Telephone Encounter (Signed)
Ok to refill??  Last office visit 02/11/2018.  Last refill 02/20/2018.

## 2018-08-04 ENCOUNTER — Other Ambulatory Visit: Payer: Self-pay | Admitting: *Deleted

## 2018-08-04 DIAGNOSIS — M5442 Lumbago with sciatica, left side: Principal | ICD-10-CM

## 2018-08-04 DIAGNOSIS — I10 Essential (primary) hypertension: Secondary | ICD-10-CM

## 2018-08-04 DIAGNOSIS — G8929 Other chronic pain: Secondary | ICD-10-CM

## 2018-08-04 MED ORDER — HYDROCODONE-ACETAMINOPHEN 5-325 MG PO TABS: 1.0000 | ORAL_TABLET | Freq: Two times a day (BID) | ORAL | 0 refills | Status: DC | PRN

## 2018-08-04 MED ORDER — PREGABALIN 75 MG PO CAPS: ORAL_CAPSULE | ORAL | 0 refills | Status: DC

## 2018-08-04 MED ORDER — LISINOPRIL 10 MG PO TABS: 10.0000 mg | ORAL_TABLET | Freq: Every day | ORAL | 1 refills | Status: DC

## 2018-08-04 NOTE — Telephone Encounter (Signed)
Received call from patient.   Requested refill on Hydrocodone/APAP, Lyrica, and Lisinopril.   Ok to refill??  Last office visit 02/11/2018.  Last refill on Hydrocodone/APAP 02/11/2018.  Last refill on Lyrica 05/09/2018.

## 2018-09-15 ENCOUNTER — Telehealth: Payer: Self-pay | Admitting: Family Medicine

## 2018-09-15 DIAGNOSIS — G8929 Other chronic pain: Secondary | ICD-10-CM

## 2018-09-15 MED ORDER — HYDROCODONE-ACETAMINOPHEN 5-325 MG PO TABS: 1.0000 | ORAL_TABLET | Freq: Two times a day (BID) | ORAL | 0 refills | Status: AC | PRN

## 2018-09-15 NOTE — Telephone Encounter (Signed)
Patient called in requesting a refill on hydrocodone that was last given on 02/10/2018. Patient states that he has excruciating pain to his lower back. Please advise?

## 2018-09-15 NOTE — Telephone Encounter (Signed)
Spoke with patient and informed him of that office visit is needed for additional refills. Patient verbalized understanding.

## 2018-09-15 NOTE — Telephone Encounter (Signed)
I have refilled painmeds He needs to schedule OV to review medications before further refills

## 2018-09-24 ENCOUNTER — Ambulatory Visit: Payer: Self-pay | Admitting: Family Medicine

## 2018-09-24 NOTE — Progress Notes (Deleted)
   Subjective:    Patient ID: Raymond Simpson, male    DOB: 03/01/1971, 48 y.o.   MRN: [id number]  Patient presents for No chief complaint on file.  Here to follow-up chronic medical problems.  His last visit he met with my PA.  Hypertension he is taking his medication as prescribed no side effects of the medication.  He is due for labs today.  Back pain he was set to have epidural injections and follow-up with spine specialist however he does take hydrocodone, Robaxin and Lyrica.  BPH he is on Flomax, last PSA Over a year ago   Meds reviewed     Review Of Systems:  GEN- denies fatigue, fever, weight loss,weakness, recent illness HEENT- denies eye drainage, change in vision, nasal discharge, CVS- denies chest pain, palpitations RESP- denies SOB, cough, wheeze ABD- denies N/V, change in stools, abd pain GU- denies dysuria, hematuria, dribbling, incontinence MSK+ joint pain,denies  muscle aches, injury Neuro- denies headache, dizziness, syncope, seizure activity       Objective:    There were no vitals taken for this visit. GEN- NAD, alert and oriented x3 HEENT- PERRL, EOMI, non injected sclera, pink conjunctiva, MMM, oropharynx clear Neck- Supple, no thyromegaly CVS- RRR, no murmur RESP-CTAB ABD-NABS,soft,NT,ND EXT- No edema Pulses- Radial, DP- 2+        Assessment & Plan:      Problem List Items Addressed This Visit    None      Note: This dictation was prepared with Dragon dictation along with smaller phrase technology. Any transcriptional errors that result from this process are unintentional.    

## 2018-11-03 ENCOUNTER — Other Ambulatory Visit: Payer: Self-pay | Admitting: Family Medicine

## 2018-11-03 DIAGNOSIS — G8929 Other chronic pain: Secondary | ICD-10-CM

## 2018-11-03 NOTE — Telephone Encounter (Signed)
Needs OV.  

## 2018-11-03 NOTE — Telephone Encounter (Signed)
Ok to refill??  Last office visit 02/11/2018.  Last refill 08/04/2018.

## 2018-11-16 ENCOUNTER — Other Ambulatory Visit: Payer: Self-pay

## 2018-11-16 ENCOUNTER — Encounter (HOSPITAL_COMMUNITY): Payer: Self-pay | Admitting: Emergency Medicine

## 2018-11-16 ENCOUNTER — Observation Stay (HOSPITAL_COMMUNITY)
Admission: EM | Admit: 2018-11-16 | Discharge: 2018-11-16 | Disposition: A | Discharge status: Home / Self Care | Payer: BC Managed Care – PPO | Attending: Internal Medicine | Admitting: Internal Medicine

## 2018-11-16 ENCOUNTER — Emergency Department (HOSPITAL_COMMUNITY): Payer: BC Managed Care – PPO

## 2018-11-16 DIAGNOSIS — R531 Weakness: Secondary | ICD-10-CM | POA: Insufficient documentation

## 2018-11-16 DIAGNOSIS — F17210 Nicotine dependence, cigarettes, uncomplicated: Secondary | ICD-10-CM | POA: Diagnosis not present

## 2018-11-16 DIAGNOSIS — Z79899 Other long term (current) drug therapy: Secondary | ICD-10-CM | POA: Insufficient documentation

## 2018-11-16 DIAGNOSIS — R55 Syncope and collapse: Secondary | ICD-10-CM | POA: Diagnosis not present

## 2018-11-16 DIAGNOSIS — Z20828 Contact with and (suspected) exposure to other viral communicable diseases: Principal | ICD-10-CM | POA: Insufficient documentation

## 2018-11-16 DIAGNOSIS — G459 Transient cerebral ischemic attack, unspecified: Secondary | ICD-10-CM

## 2018-11-16 DIAGNOSIS — R51 Headache: Secondary | ICD-10-CM | POA: Diagnosis not present

## 2018-11-16 HISTORY — DX: Other chronic pain: G89.29

## 2018-11-16 HISTORY — DX: Syncope and collapse: R55

## 2018-11-16 HISTORY — DX: Sciatica, unspecified side: M54.30

## 2018-11-16 LAB — DIFFERENTIAL
Abs Immature Granulocytes: 0.01 10*3/uL (ref 0.00–0.07)
Basophils Absolute: 0.1 10*3/uL (ref 0.0–0.1)
Basophils Relative: 1 %
Eosinophils Absolute: 0.5 10*3/uL (ref 0.0–0.5)
Eosinophils Relative: 11 %
Immature Granulocytes: 0 %
Lymphocytes Relative: 45 %
Lymphs Abs: 2.2 10*3/uL (ref 0.7–4.0)
Monocytes Absolute: 0.5 10*3/uL (ref 0.1–1.0)
Monocytes Relative: 10 %
Neutro Abs: 1.6 10*3/uL — ABNORMAL LOW (ref 1.7–7.7)
Neutrophils Relative %: 33 %

## 2018-11-16 LAB — PROTIME-INR
INR: 1 (ref 0.8–1.2)
Prothrombin Time: 13.3 seconds (ref 11.4–15.2)

## 2018-11-16 LAB — COMPREHENSIVE METABOLIC PANEL
ALT: 12 U/L (ref 0–44)
AST: 15 U/L (ref 15–41)
Albumin: 4.2 g/dL (ref 3.5–5.0)
Alkaline Phosphatase: 71 U/L (ref 38–126)
Anion gap: 9 (ref 5–15)
BUN: 15 mg/dL (ref 6–20)
CO2: 27 mmol/L (ref 22–32)
Calcium: 9.2 mg/dL (ref 8.9–10.3)
Chloride: 100 mmol/L (ref 98–111)
Creatinine, Ser: 1.24 mg/dL (ref 0.61–1.24)
GFR calc Af Amer: >60 mL/min (ref >60)
GFR calc non Af Amer: >60 mL/min (ref >60)
Glucose, Bld: 94 mg/dL (ref 70–99)
Potassium: 4.3 mmol/L (ref 3.5–5.1)
Sodium: 136 mmol/L (ref 135–145)
Total Bilirubin: 0.7 mg/dL (ref 0.3–1.2)
Total Protein: 7.5 g/dL (ref 6.5–8.1)

## 2018-11-16 LAB — I-STAT CHEM 8, ED
BUN: 14 mg/dL (ref 6–20)
Calcium, Ion: 1.18 mmol/L (ref 1.15–1.40)
Chloride: 100 mmol/L (ref 98–111)
Creatinine, Ser: 1.2 mg/dL (ref 0.61–1.24)
Glucose, Bld: 89 mg/dL (ref 70–99)
HCT: 43 % (ref 39.0–52.0)
Hemoglobin: 14.6 g/dL (ref 13.0–17.0)
Potassium: 4.4 mmol/L (ref 3.5–5.1)
Sodium: 136 mmol/L (ref 135–145)
TCO2: 30 mmol/L (ref 22–32)

## 2018-11-16 LAB — TROPONIN I (HIGH SENSITIVITY)
Troponin I (High Sensitivity): 3 ng/L (ref <18)
Troponin I (High Sensitivity): 3 ng/L (ref <18)

## 2018-11-16 LAB — URINALYSIS, ROUTINE W REFLEX MICROSCOPIC
Bilirubin Urine: NEGATIVE
Glucose, UA: NEGATIVE mg/dL
Ketones, ur: NEGATIVE mg/dL
Leukocytes,Ua: NEGATIVE
Nitrite: NEGATIVE
Protein, ur: NEGATIVE mg/dL
Specific Gravity, Urine: >1.046 — ABNORMAL HIGH (ref 1.005–1.030)
pH: 6 (ref 5.0–8.0)

## 2018-11-16 LAB — RAPID URINE DRUG SCREEN, HOSP PERFORMED
Amphetamines: NOT DETECTED
Barbiturates: NOT DETECTED
Benzodiazepines: NOT DETECTED
Cocaine: POSITIVE — AB
Opiates: NOT DETECTED
Tetrahydrocannabinol: POSITIVE — AB

## 2018-11-16 LAB — CBC
HCT: 46.2 % (ref 39.0–52.0)
Hemoglobin: 14.8 g/dL (ref 13.0–17.0)
MCH: 30.5 pg (ref 26.0–34.0)
MCHC: 32 g/dL (ref 30.0–36.0)
MCV: 95.3 fL (ref 80.0–100.0)
Platelets: 207 10*3/uL (ref 150–400)
RBC: 4.85 MIL/uL (ref 4.22–5.81)
RDW: 12.1 % (ref 11.5–15.5)
WBC: 4.8 10*3/uL (ref 4.0–10.5)
nRBC: 0 % (ref 0.0–0.2)

## 2018-11-16 LAB — APTT: aPTT: 31 seconds (ref 24–36)

## 2018-11-16 LAB — MAGNESIUM: Magnesium: 2 mg/dL (ref 1.7–2.4)

## 2018-11-16 MED ORDER — ASPIRIN 325 MG PO TABS: 325.0000 mg | ORAL_TABLET | Freq: Once | ORAL | Status: DC

## 2018-11-16 MED ORDER — ACETAMINOPHEN 650 MG RE SUPP: 650.0000 mg | Freq: Four times a day (QID) | RECTAL | Status: DC | PRN

## 2018-11-16 MED ORDER — ENOXAPARIN SODIUM 40 MG/0.4ML ~~LOC~~ SOLN: 40.0000 mg | SUBCUTANEOUS | Status: DC

## 2018-11-16 MED ORDER — IOHEXOL 350 MG/ML SOLN
75.0000 mL | Freq: Once | INTRAVENOUS | Status: AC | PRN
  Administered 2018-11-16: 75 mL via INTRAVENOUS

## 2018-11-16 MED ORDER — ONDANSETRON HCL 4 MG/2ML IJ SOLN: 4.0000 mg | Freq: Four times a day (QID) | INTRAMUSCULAR | Status: DC | PRN

## 2018-11-16 MED ORDER — PREGABALIN 75 MG PO CAPS: 75.0000 mg | ORAL_CAPSULE | Freq: Every day | ORAL | Status: DC

## 2018-11-16 MED ORDER — ONDANSETRON HCL 4 MG PO TABS: 4.0000 mg | ORAL_TABLET | Freq: Four times a day (QID) | ORAL | Status: DC | PRN

## 2018-11-16 MED ORDER — ACETAMINOPHEN 325 MG PO TABS: 650.0000 mg | ORAL_TABLET | Freq: Four times a day (QID) | ORAL | Status: DC | PRN

## 2018-11-16 MED ORDER — POLYETHYLENE GLYCOL 3350 17 G PO PACK: 17.0000 g | PACK | Freq: Every day | ORAL | Status: DC | PRN

## 2018-11-16 MED ORDER — PREGABALIN 75 MG PO CAPS: 150.0000 mg | ORAL_CAPSULE | Freq: Every day | ORAL | Status: DC

## 2018-11-16 MED ORDER — PREGABALIN 75 MG PO CAPS: 75.0000 mg | ORAL_CAPSULE | Freq: Two times a day (BID) | ORAL | Status: DC

## 2018-11-16 NOTE — ED Notes (Addendum)
Pt observed walking out side with all monitoring cables still attached and IV in arm. When asking pt where he was going, but stated that "the ones in the back told me [facility] and security notified.

## 2018-11-16 NOTE — ED Triage Notes (Addendum)
Reports for past 2 days right ear has been hurting. Per patient he dropped something on floor while cooking and bent over causing him to become dizzy and fall. Patient states hit right side of head but has left side neck and shoulder pain. Pain increased with lifting arm. Patient also states mild headache and dizzy with standing. Per patient brief LOC. Patient states "It couldn't have been long cause my food wasn't burnt."

## 2018-11-16 NOTE — Discharge Planning (Signed)
  Called that patient wanted to leave [facility] on getting to floor.  I asked to speak with patient but patient declined speaking to me.  AMA documents given to patient to sign.   Please see H&P of same date for details of admission.   Jenetta Downer, MD.  11/16/18   11:22 PM

## 2018-11-16 NOTE — Progress Notes (Signed)
Patient has just arrived from the ED to [facility]. Patient stated that he wanted his IV out and he wants to leave. Patient did not give specific reason for his desire to leave. MD was contacted and requested to speak with the patient. Patient refused to speak with MD and stated " he was 3 something years old and did not need anyone to tell him what to do" Risks of leaving against medical advice (AMA) was discussed with patient including worsening of symptoms. IV catheter has been removed and is clean dry and intact. AMA document signed and is in chart.

## 2018-11-16 NOTE — ED Provider Notes (Signed)
[facility] EMERGENCY DEPARTMENT Provider Note   CSN: [id number] Arrival date & time: 11/16/18  1220     History   Chief Complaint Chief Complaint  Patient presents with   Loss of Consciousness    HPI Raymond RoyalsBruce A Simpson is a 48 y.o. male.     HPI   Patient is a 48 yo male with a PMH of allergic rhinitis, bipolar disorder, recurrent syncope (usually in the setting of bending over), hypertension, smoking history presenting for syncope episode and right sided neck/ear pain. Patient reports that event occurred at 8-8:30 am while making breakfast. He reports that he bend over to pick up a dropped object, felt "dizzy", and found himself on the floor. He reports that he had blurred vision when he awoke. He thought that he noticed that his left sided had a "tingling" sensation and felt weaker, but this improved upon presentation to the hospital. Patient denies any facial weakness, dysarthria, or vertigo. Patient reports he has had 2-3 days of a throbbing sensation "in his right ear". Denies drainage from the near. He reports chronic pain down his entire spine, and feels a "shooting" sensation starting in his cervical spine and extending down the entire spine, which has been present for 2-3 years. Denies chest pain or pressure, shortness of breath, abdominal pain, nausea, or vomiting. Patient reports a recurrent history of syncope while bending down. He did eat this morning. He has a family history of ALS in his mother but no first degree relatives with CVA. Patient has HTN and does smoke. Pt admits to cocaine use 2 days ago in the setting of severe sciatica pain but this is not regular per pt.   Past Medical History:  Diagnosis Date   Allergy    seasonal   Anxiety    Bipolar 1 disorder (HCC)    Chronic back pain    Depression    Herpes    right eye   Sciatica    Syncope    intermittent since 2018    Patient Active Problem List   Diagnosis Date Noted   Current smoker  01/30/2018   Chronic left-sided low back pain with left-sided sciatica 01/30/2018   Hypertension 05/15/2017   BPH (benign prostatic hyperplasia) 05/15/2017   Generalized joint pain 05/07/2017   Myalgia 05/07/2017   Syncopal episodes 05/07/2017   GAD (generalized anxiety disorder) 05/07/2017   Tinea versicolor 05/07/2017    Past Surgical History:  Procedure Laterality Date   HERNIA REPAIR     KNEE SURGERY          Home Medications    Prior to Admission medications   Medication Sig Start Date End Date Taking? Authorizing Provider  HYDROcodone-acetaminophen (NORCO/VICODIN) 5-325 MG tablet Take 1 tablet by mouth 2 (two) times daily as needed for severe pain. 09/15/18  Yes [place], [name], MD  lisinopril (ZESTRIL) 10 MG tablet Take 1 tablet (10 mg total) by mouth daily. 08/04/18  Yes [place], [name], MD  methocarbamol (ROBAXIN-750) 750 MG tablet Take 1 tablet (750 mg total) by mouth every 8 (eight) hours as needed for muscle spasms. 02/20/18  Yes Danelle Berryapia, Leisa, PA-C  Multiple Vitamin (MULTIVITAMIN WITH MINERALS) TABS Take 1 tablet by mouth daily. MEGA MENS MULTIVITAMIN   Yes [provider]  pregabalin (LYRICA) 75 MG capsule TAKE ONE CAPSULE BY MOUTH EVERY MORNING AND 2 CAPSULES EVERY EVENING 11/03/18  Yes [place], [name], MD  selenium sulfide (SELSUN) 2.5 % shampoo Apply as prescribed daily to affected areas  on skin 02/11/18  Yes Danelle Berryapia, Leisa, PA-C  tetrahydrozoline (VISINE RED EYE COMFORT) 0.05 % ophthalmic solution Place 1 drop into both eyes 2 (two) times daily.   Yes [provider]  omeprazole (PRILOSEC) 40 MG capsule Take 1 capsule (40 mg total) by mouth daily. On empty stomach Patient not taking: Reported on 11/16/2018 02/11/18   Danelle Berryapia, Leisa, PA-C  tamsulosin (FLOMAX) 0.4 MG CAPS capsule Take 1 capsule (0.4 mg total) by mouth daily after supper. For prostate Patient not taking: Reported on 11/16/2018 01/30/18   Danelle Berryapia, Leisa, PA-C  triamcinolone (KENALOG)  0.025 % ointment Apply 1 application topically 2 (two) times daily as needed. Patient not taking: Reported on 11/16/2018 02/11/18   Danelle Berryapia, Leisa, PA-C    Family History Family History  Problem Relation Age of Onset   ALS Mother    Arthritis Mother    Vision loss Mother    Alcohol abuse Father    Drug abuse Father    Vision loss Father    Arthritis Maternal Grandmother    Cancer Maternal Grandmother        pancreatic   Arthritis Maternal Grandfather    Alcohol abuse Paternal Grandmother    Alcohol abuse Paternal Grandfather     Social History Social History   Tobacco Use   Smoking status: Current Every Day Smoker    Packs/day: 1.00    Years: 15.00    Pack years: 15.00    Types: Cigarettes   Smokeless tobacco: Never Used  Substance Use Topics   Alcohol use: Yes    Frequency: Never    Comment: Occ   Drug use: No     Allergies   Patient has no known allergies.   Review of Systems Review of Systems  Constitutional: Negative for chills and fever.  HENT: Negative for congestion, rhinorrhea, sinus pain and sore throat.   Eyes: Positive for visual disturbance.  Respiratory: Negative for cough, chest tightness and shortness of breath.   Cardiovascular: Negative for chest pain, palpitations and leg swelling.  Gastrointestinal: Negative for abdominal pain, nausea and vomiting.  Genitourinary: Negative for dysuria and flank pain.  Musculoskeletal: Negative for back pain and myalgias.  Skin: Negative for rash.  Neurological: Positive for dizziness, syncope, weakness, light-headedness and numbness. Negative for facial asymmetry, speech difficulty and headaches.     Physical Exam Updated Vital Signs BP (!) 156/98 (BP Location: Right Arm)    Pulse 91    Temp 98.4 F (36.9 C) (Oral)    Resp 16    Ht 5\' 6"  (1.676 m)    Wt 68 kg    SpO2 99%    BMI 24.21 kg/m   Physical Exam Vitals signs and nursing note reviewed.  Constitutional:      General: He is not in  acute distress.    Appearance: He is well-developed.  HENT:     Head: Normocephalic and atraumatic.     Right Ear: Tympanic membrane normal.     Left Ear: Tympanic membrane normal.     Ears:     Comments: No pain with palpation of left mastoid. Eyes:     Conjunctiva/sclera: Conjunctivae normal.     Pupils: Pupils are equal, round, and reactive to light.  Neck:     Musculoskeletal: Normal range of motion and neck supple.     Comments: No midline tenderness. Cardiovascular:     Rate and Rhythm: Normal rate and regular rhythm.     Heart sounds: S1 normal and S2 normal.  No murmur.  Pulmonary:     Effort: Pulmonary effort is normal.     Breath sounds: Normal breath sounds. No wheezing or rales.  Abdominal:     General: There is no distension.     Palpations: Abdomen is soft.     Tenderness: There is no abdominal tenderness. There is no guarding.  Musculoskeletal: Normal range of motion.        General: No deformity.  Lymphadenopathy:     Cervical: No cervical adenopathy.  Skin:    General: Skin is warm and dry.     Findings: No erythema or rash.  Neurological:     Mental Status: He is alert.     Comments: Mental Status:  Alert, oriented, thought content appropriate, able to give a coherent history. Speech fluent without evidence of aphasia. Able to follow 2 step commands without difficulty.  Cranial Nerves:  II:  Peripheral visual fields grossly normal, pupils equal, round, reactive to light III,IV, VI: ptosis not present, extra-ocular motions intact bilaterally  V,VII: smile symmetric, facial light touch sensation asymmetric with decreased sensation on right. Reports decreased sensation to cold touch on left. VIII: hearing grossly normal to voice  X: uvula elevates symmetrically  XI: bilateral shoulder shrug symmetric and strong XII: midline tongue extension without fassiculations Motor:  Normal tone. 4+/5 in upper and lower extremities of left including strong and equal grip  strength and dorsiflexion/plantar flexion. 5/5 right upper extremity throughout. Sensory: Pinprick and light touch normal in all extremities.  Deep Tendon Reflexes: 2+ and symmetric in the biceps and patella. No clonus. Cerebellar: normal finger-to-nose with bilateral upper extremities Gait: normal gait and balance Stance: No pronator drift and good coordination, strength, and position sense with tapping of bilateral arms, however LUE is weaker with tapping (performed in sitting position). CV: distal pulses palpable throughout   Psychiatric:        Behavior: Behavior normal.        Thought Content: Thought content normal.        Judgment: Judgment normal.      Large Artery Stroke Screening     Weakness:  Mild (minor drift)  Vision:  NONE  Aphasia:  NONE  Neglect:  NONE:  VAN =  Negative  If patient has any weakness PLUS any one of the below: Visual Disturbance (field cut, double, or blind vision) Aphasia (inability to speak or understand) Neglect (gaze to one side or ignoring one side) This is likely a large artery clot (cortical symptoms) = VAN Positive  ED Treatments / Results  Labs (all labs ordered are listed, but only abnormal results are displayed) Labs Reviewed  PROTIME-INR  APTT  CBC  DIFFERENTIAL  COMPREHENSIVE METABOLIC PANEL  RAPID URINE DRUG SCREEN, HOSP PERFORMED  URINALYSIS, ROUTINE W REFLEX MICROSCOPIC  CBG MONITORING, ED  I-STAT CHEM 8, ED    EKG EKG Interpretation  Date/Time:  Sunday November 16 2018 12:46:29 EDT Ventricular Rate:  87 PR Interval:    QRS Duration: 85 QT Interval:  367 QTC Calculation: 442 R Axis:   73 Text Interpretation:  Sinus rhythm ST elev, probable normal early repol pattern When compared with ECG of 07/20/2010 No significant change was found Confirmed by Francine Graven [phone]) on 11/16/2018 1:30:12 PM   Radiology No results found.  Procedures Procedures (including critical care time)  Medications Ordered in  ED Medications - No data to display   Initial Impression / Assessment and Plan / ED Course  I have reviewed the triage vital  signs and the nursing notes.  Pertinent labs & imaging results that were available during my care of the patient were reviewed by me and considered in my medical decision making (see chart for details).  Clinical Course as of Nov 15 1613  Wynelle LinkSun Nov 16, 2018  1359 Spoke with teleneuro who is awaiting CT to go do tele exam.    [AM]  1432 Spoke with Dr. Nelson ChimesAmin of IR who states that CT angiogram is correct approach at this time. Recommends TIA admission if negative for CVA and to get MR brain and Cspine WO contrast. Al   [AM]  1553 Spoke with Dr. Mariea ClontsEmokpae who will admit patient. Appreciate her involvement.    [AM]    Clinical Course User Index [AM] Elisha PonderMurray, Raykwon Hobbs B, PA-C       This is a 48 yo male with a PMH of HTN, syncope, sciatic nerve impingement presenting for syncopal episode followed by left sided weakness, numbess. Symptoms gradually improving but still present on neuro exam. Pt presenting approximately 4.5-5 hours since LKW. Pt is LVO negative.   CT angio obtained per recommendation of Teleneuro, who agreed pt is not meeting criteria for LVO stroke, and is outside tPA window. CT without dissection, aneurysm, or obvious acute stroke, but patient is still within 6 hours of symptoms. EKG in NSR without evidence of ischemia, infarction, or arrhythmia. CBC, CMP unremarkable. UDS positive for cocaine and cannabis per pt report. Would recommend TIA admission. Appreciate their involvement.   This is a supervised visit with Dr. Samuel JesterKathleen McManus. Evaluation, management, and disposition planning discussed with this attending physician.  Final Clinical Impressions(s) / ED Diagnoses   Final diagnoses:  Syncope, unspecified syncope type  Weakness    ED Discharge Orders    None       Elisha PonderMurray, Arien Benincasa B, PA-C 11/16/18 1640    Samuel JesterMcManus, Kathleen, DO 11/17/18 [phone]

## 2018-11-16 NOTE — Consult Note (Signed)
[facility] Tele[facility]urology Consult Services  Stat Consult  Date of Service:   11/16/2018 13:54:05  Impression:     .  Transient Ischemic Attack     .  Syncope  Comments/Sign-Out: I discussed the case in detail with the patient. I would suggest getting a CT of the head and if it is negative, I would suggest getting the MRI, MRA of the head and neck and MRI of the C-spine. I would recommend putting him on antiplatelets and follow-up with neurology after the above-mentioned work-up is done. He would also need to be on telemetry monitoring and would need cardiac work-up also for syncope. I would also suggest checking orthostatic pulse and blood pressure.   Addendum: CTA has already been ordered for Dissection and if its negative, no need for MRA  CT HEAD: Reviewed  Metrics: [facility] Notification Time: 11/16/2018 13:52:33 Stamp Time: 11/16/2018 13:54:05 Callback Response Time: 11/16/2018 13:56:12 Video Start Time: 11/16/2018 14:11:32 Video End Time: 11/16/2018 14:31:29  Our recommendations are outlined below.  Recommendations:     .  Antiplatelet Therapy   Imaging Studies:     .  MRI Head     .  Echocardiogram - Transthoracic Echocardiogram  Disposition: Neurology Follow Up Recommended  Sign Out:     .  Discussed with Emergency Department Provider  ----------------------------------------------------------------------------------------------------  Chief Complaint: Syncope  History of Present Illness: Patient is a 48 year old Male.  48 year old male with history of hypertension and smoking, came to the hospital because of a syncopal episode. He reports he has some pain on the right side of his neck, face and ear. This is going on for some time. This morning he bent down to pick something off the floor when he got dizzy, lightheaded and almost passed out. He noticed that his left arm and leg was weak. There was no problem with his speech or  swallowing. He reports it lasted for few minutes and then resolved. He came to the hospital for further evaluation of this. He denies any previous episodes like this. Denies any seizure activity. Denies any headaches.   Examination:  Neuro Exam:  General: Alert,Awake, Oriented to Time, Place, Person  Speech: Fluent:  Language: Intact:  Face: Symmetric:  Facial Sensation: Intact:  Visual Fields: Intact:  Extraocular Movements: Intact:  Motor Exam: No Drift:  Sensation: Intact:  Coordination: Intact:     Patient/Family was informed the Neurology Consult would happen via TeleHealth consult by way of interactive audio and video telecommunications and consented to receiving care in this manner.  Due to the immediate potential for life-threatening deterioration due to underlying acute neurologic illness, I spent 35 minutes providing critical care. This time includes time for face to face visit via telemedicine, review of medical records, imaging studies and discussion of findings with providers, the patient and/or family.   Dr Faustino Congress   [facility] [phone]  Case [id number]

## 2018-11-16 NOTE — H&P (Addendum)
History and Physical    Raymond RoyalsBruce A Totton RUE:[id number] DOB: 08/21/1970 DOA: 11/16/2018  PCP: Salley Scarleturham, Kawanta F, MD   Patient coming from: Home  I have personally briefly [facility]  Chief Complaint: Passing out, Left sided weakness, right ear pain  HPI: Raymond Simpson is a 48 y.o. male with medical history significant for depression, bipolar disorder and anxiety, BPH, hypertension. Patient reports that at about 8 - 8.30 this morning, he had eaten breakfast and he was cooking dinner when he bent his head down to get something from the cabinet felt dizzy passed out.  He thinks he may have been out for < 1 minute as his food had not burnt.  When he came to, he walked to the bathroom and noticed that he was having difficulty lifting his left upper extremity and he was dragging his left foot, with tingling sensation.  He denies chest pain or difficulty breathing. Patient has never had a stroke, but he reports some mild chronic numbness in his left hand.  He smokes 1/2 to 1 pack of cigarettes daily.  Family history of strokes.  Mother has ALS. Patient reports he has had a similar episode of passing out about 3 years ago sustaining a bruise to his mid forehead, but he did not seek medical attention at that time.  Patient reports chronic dizziness whenever he bends his head downwards.  He also has chronic neck pain that radiates down his spine,  for which he is on chronic opioids and Lyrica.  He reports he used cocaine 2 days ago for his pain.  But otherwise this is not a regular occurrence.  Reports occasional/social intake of alcoholic beverages. Patient also reports pain in his right ear.  He denies hearing loss or ear discharge from that ear.  ED Course: Blood pressure 156/98, unremarkable CBC, BMP, UDS positive for cocaine and cannabinoids.  UA with rare bacteria small hemoglobin, with increased specific gravity.  CTA head and neck-negative, mild  atherosclerotic disease in carotid bifurcation.  No significant intracranial stenosis, negative for large vessel occlusion.  Telemetry neurology was consulted-antiplatelet, MRI, brain and MRI of the C-spine recommended, stroke and syncope work-up.  On arrival to the ED most of patient's symptoms mostly resolved, but ED provider detected mild deficits to left extremity.  Review of Systems: As per HPI all other systems reviewed and negative.  Past Medical History:  Diagnosis Date   Allergy    seasonal   Anxiety    Bipolar 1 disorder (HCC)    Chronic back pain    Depression    Herpes    right eye   Sciatica    Syncope    intermittent since 2018    Past Surgical History:  Procedure Laterality Date   HERNIA REPAIR     KNEE SURGERY       reports that he has been smoking cigarettes. He has a 15.00 pack-year smoking history. He has never used smokeless tobacco. He reports current alcohol use. He reports that he does not use drugs.  No Known Allergies  Family History  Problem Relation Age of Onset   ALS Mother    Arthritis Mother    Vision loss Mother    Alcohol abuse Father    Drug abuse Father    Vision loss Father    Arthritis Maternal Grandmother    Cancer Maternal Grandmother        pancreatic   Arthritis Maternal Grandfather  Alcohol abuse Paternal Grandmother    Alcohol abuse Paternal Grandfather     Prior to Admission medications   Medication Sig Start Date End Date Taking? Authorizing Provider  HYDROcodone-acetaminophen (NORCO/VICODIN) 5-325 MG tablet Take 1 tablet by mouth 2 (two) times daily as needed for severe pain. 09/15/18  Yes [place], [name], MD  lisinopril (ZESTRIL) 10 MG tablet Take 1 tablet (10 mg total) by mouth daily. 08/04/18  Yes [place], [name], MD  methocarbamol (ROBAXIN-750) 750 MG tablet Take 1 tablet (750 mg total) by mouth every 8 (eight) hours as needed for muscle spasms. 02/20/18  Yes Danelle Berryapia, Leisa, PA-C  Multiple Vitamin  (MULTIVITAMIN WITH MINERALS) TABS Take 1 tablet by mouth daily. MEGA MENS MULTIVITAMIN   Yes [provider]  pregabalin (LYRICA) 75 MG capsule TAKE ONE CAPSULE BY MOUTH EVERY MORNING AND 2 CAPSULES EVERY EVENING 11/03/18  Yes , [name], MD  selenium sulfide (SELSUN) 2.5 % shampoo Apply as prescribed daily to affected areas on skin 02/11/18  Yes Danelle Berryapia, Leisa, PA-C  tetrahydrozoline (VISINE RED EYE COMFORT) 0.05 % ophthalmic solution Place 1 drop into both eyes 2 (two) times daily.   Yes [provider]  omeprazole (PRILOSEC) 40 MG capsule Take 1 capsule (40 mg total) by mouth daily. On empty stomach Patient not taking: Reported on 11/16/2018 02/11/18   Danelle Berryapia, Leisa, PA-C  tamsulosin (FLOMAX) 0.4 MG CAPS capsule Take 1 capsule (0.4 mg total) by mouth daily after supper. For prostate Patient not taking: Reported on 11/16/2018 01/30/18   Danelle Berryapia, Leisa, PA-C  triamcinolone (KENALOG) 0.025 % ointment Apply 1 application topically 2 (two) times daily as needed. Patient not taking: Reported on 11/16/2018 02/11/18   Danelle Berryapia, Leisa, PA-C    Physical Exam: Vitals:   11/16/18 1239 11/16/18 1240  BP:  (!) 156/98  Pulse:  91  Resp:  16  Temp:  98.4 F (36.9 C)  TempSrc:  Oral  SpO2:  99%  Weight: 68 kg   Height: 5\' 6"  (1.676 m)     Constitutional: NAD, calm, comfortable Vitals:   11/16/18 1239 11/16/18 1240  BP:  (!) 156/98  Pulse:  91  Resp:  16  Temp:  98.4 F (36.9 C)  TempSrc:  Oral  SpO2:  99%  Weight: 68 kg   Height: 5\' 6"  (1.676 m)    Eyes: PERRL, lids and conjunctivae normal ENMT: mucous membranes are moist. Posterior pharynx clear of any exudate or lesions.Normal dentition.  Mild tenderness on palpation to cervical spine area. Neck: normal, supple, no masses, no thyromegaly Respiratory: clear to auscultation bilaterally, no wheezing, no crackles. Normal respiratory effort. No accessory muscle use.  Cardiovascular: Regular rate and rhythm, no murmurs / rubs /  gallops. No extremity edema. 2+ pedal pulses.  Abdomen: no tenderness, no masses palpated. No hepatosplenomegaly. Bowel sounds positive.  Musculoskeletal: no clubbing / cyanosis. No joint deformity upper and lower extremities. Good ROM, no contractures. Normal muscle tone.  Skin: no rashes, lesions, ulcers. No induration Neurologic: Impaired sensation to the right side of his face, left upper extremity 5/5, left lower extremity 4+/5, right upper and lower extremity strength 5/5.  Otherwise cranial nerves intact. Psychiatric: Normal judgment and insight. Alert and oriented x 3. Normal mood.   Labs on Admission: I have personally reviewed following labs and imaging studies  CBC: Recent Labs  Lab 11/16/18 1339 11/16/18 1407  WBC 4.8  --   NEUTROABS 1.6*  --   HGB 14.8 14.6  HCT 46.2 43.0  MCV 95.3  --   PLT 207  --    Basic Metabolic Panel: Recent Labs  Lab 11/16/18 1339 11/16/18 1407  NA 136 136  K 4.3 4.4  CL 100 100  CO2 27  --   GLUCOSE 94 89  BUN 15 14  CREATININE 1.24 1.20  CALCIUM 9.2  --    Liver Function Tests: Recent Labs  Lab 11/16/18 1339  AST 15  ALT 12  ALKPHOS 71  BILITOT 0.7  PROT 7.5  ALBUMIN 4.2   Coagulation Profile: Recent Labs  Lab 11/16/18 1339  INR 1.0   Urine analysis:    Component Value Date/Time   COLORURINE YELLOW 11/16/2018 1339   APPEARANCEUR CLEAR 11/16/2018 1339   LABSPEC >1.046 (H) 11/16/2018 1339   PHURINE 6.0 11/16/2018 1339   GLUCOSEU NEGATIVE 11/16/2018 1339   HGBUR SMALL (A) 11/16/2018 1339   BILIRUBINUR NEGATIVE 11/16/2018 1339   KETONESUR NEGATIVE 11/16/2018 1339   PROTEINUR NEGATIVE 11/16/2018 1339   UROBILINOGEN 0.2 12/02/2011 1740   NITRITE NEGATIVE 11/16/2018 1339   LEUKOCYTESUR NEGATIVE 11/16/2018 1339    Radiological Exams on Admission: Ct Angio Head W Or Wo Contrast  Result Date: 11/16/2018 CLINICAL DATA:  Sudden headache.  Rule out dissection. EXAM: CT ANGIOGRAPHY HEAD AND NECK TECHNIQUE: Multidetector  CT imaging of the head and neck was performed using the standard protocol during bolus administration of intravenous contrast. Multiplanar CT image reconstructions and MIPs were obtained to evaluate the vascular anatomy. Carotid stenosis measurements (when applicable) are obtained utilizing NASCET criteria, using the distal internal carotid diameter as the denominator. CONTRAST:  23mL OMNIPAQUE IOHEXOL 350 MG/ML SOLN COMPARISON:  CT head 07/20/2010 FINDINGS: CT HEAD FINDINGS Brain: No evidence of acute infarction, hemorrhage, hydrocephalus, extra-axial collection or mass lesion/mass effect. Vascular: Negative for hyperdense vessel Skull: Negative Sinuses: Negative Orbits: Negative Review of the MIP images confirms the above findings CTA NECK FINDINGS Aortic arch: Standard branching. Imaged portion shows no evidence of aneurysm or dissection. No significant stenosis of the major arch vessel origins. Right carotid system: Mild atherosclerotic disease right carotid bifurcation without significant stenosis. Negative for dissection Left carotid system: Mild atherosclerotic disease left carotid bifurcation without significant stenosis. Negative for dissection. Vertebral arteries: Both vertebral arteries widely patent bilaterally without stenosis or dissection Skeleton: No acute abnormality. Other neck: Negative for mass or adenopathy. Upper chest: Mild apically emphysema bilaterally. Review of the MIP images confirms the above findings CTA HEAD FINDINGS Anterior circulation: Mild atherosclerotic disease in the cavernous carotid bilaterally without stenosis or aneurysm. Anterior and middle cerebral arteries widely patent bilaterally. Posterior circulation: Both vertebral arteries patent to the basilar. PICA patent bilaterally. Basilar widely patent. Superior cerebellar and posterior cerebral arteries patent bilaterally without stenosis. Negative for aneurysm Venous sinuses: Normal venous enhancement. Anatomic variants:  None Review of the MIP images confirms the above findings IMPRESSION: 1. Mild atherosclerotic disease in the carotid bifurcation. No significant carotid or vertebral artery stenosis or dissection. 2. No significant intracranial stenosis. Negative for large vessel occlusion 3. Negative CT head Electronically Signed   By: Franchot Gallo M.D.   On: 11/16/2018 14:48   Ct Angio Neck W And/or Wo Contrast  Result Date: 11/16/2018 CLINICAL DATA:  Sudden headache.  Rule out dissection. EXAM: CT ANGIOGRAPHY HEAD AND NECK TECHNIQUE: Multidetector CT imaging of the head and neck was performed using the standard protocol during bolus administration of intravenous contrast. Multiplanar CT image reconstructions and MIPs were obtained to evaluate the vascular anatomy. Carotid stenosis measurements (when  applicable) are obtained utilizing NASCET criteria, using the distal internal carotid diameter as the denominator. CONTRAST:  75mL OMNIPAQUE IOHEXOL 350 MG/ML SOLN COMPARISON:  CT head 07/20/2010 FINDINGS: CT HEAD FINDINGS Brain: No evidence of acute infarction, hemorrhage, hydrocephalus, extra-axial collection or mass lesion/mass effect. Vascular: Negative for hyperdense vessel Skull: Negative Sinuses: Negative Orbits: Negative Review of the MIP images confirms the above findings CTA NECK FINDINGS Aortic arch: Standard branching. Imaged portion shows no evidence of aneurysm or dissection. No significant stenosis of the major arch vessel origins. Right carotid system: Mild atherosclerotic disease right carotid bifurcation without significant stenosis. Negative for dissection Left carotid system: Mild atherosclerotic disease left carotid bifurcation without significant stenosis. Negative for dissection. Vertebral arteries: Both vertebral arteries widely patent bilaterally without stenosis or dissection Skeleton: No acute abnormality. Other neck: Negative for mass or adenopathy. Upper chest: Mild apically emphysema bilaterally.  Review of the MIP images confirms the above findings CTA HEAD FINDINGS Anterior circulation: Mild atherosclerotic disease in the cavernous carotid bilaterally without stenosis or aneurysm. Anterior and middle cerebral arteries widely patent bilaterally. Posterior circulation: Both vertebral arteries patent to the basilar. PICA patent bilaterally. Basilar widely patent. Superior cerebellar and posterior cerebral arteries patent bilaterally without stenosis. Negative for aneurysm Venous sinuses: Normal venous enhancement. Anatomic variants: None Review of the MIP images confirms the above findings IMPRESSION: 1. Mild atherosclerotic disease in the carotid bifurcation. No significant carotid or vertebral artery stenosis or dissection. 2. No significant intracranial stenosis. Negative for large vessel occlusion 3. Negative CT head Electronically Signed   By: Marlan Palauharles  Clark M.D.   On: 11/16/2018 14:48    EKG: Independently reviewed.  Sinus rhythm, QTC 442.  ST segment repolarization abnormalities lead II, III, aVF and V3 through V6.  No significant EKG changes from prior.  Assessment/Plan Active Problems:   TIA (transient ischemic attack)   TIA/CVA- mild deficits present on exam.  Patient outside TPA window.  CTA head and neck negative for large vessel occlusion.  Chronic cervical spine pain.  Patient using cocaine-likely etiology.  Telemetry neurology consulted in ED. -MRI brain and C-spine -Echocardiogram -Aspirin 325, continue 81 mg daily -Deferred carotid Dopplers, CTA neck done-showed mild atherosclerotic disease in the carotid bifurcation -Neurology consult -PT OT evaluation -Neurochecks -Counseled to quit substance abuse. -Allow for permissive hypertension  Syncope- positional with preceding dizziness.  Substance abuse history.  Hypertensives lisinopril 10 mg daily, tamsulosin.  No chest pain or difficulty breathing.  High-sensitivity troponin x2- negative.  EKG with repolarization  abnormalities.  CTA neck negative for dissection or large vessel occlusion.  Doubt BPPV should result in syncope.  Patient works outside, but tells me he stays very hydrated despite UA showing increased specific gravity. -Orthostatic vitals -Echocardiogram -Telemetry monitoring  Ear pain-2 days.  No discharge or hearing problems. -May need to follow-up as outpatient  Substance abuse- tobacco, cocaine, cannabis. -Counseled to quit  Hypertension-stable. -Hold tamsulosin and lisinopril for now to allow for permissive hypertension  BPH-stable -For permissive hypertension  Chronic Neck pain -no cervical imaging on file.  lumbar MRI 04/2017 disc and facet degeneration L4-5 contributing to subarticular and foraminal stenosis bilaterally.  Other mild degenerative changes. -Resume home Lyrica and home narcotics  DVT prophylaxis: Lovenox Code Status: Full Family Communication: Patient's cousin at bedside Disposition Plan: 1 to 2 days Consults called: Neurology Admission status: Obs, telemetry   Onnie BoerEjiroghene E Niana Martorana MD [facility]  11/16/2018, 5:52 PM

## 2018-11-17 ENCOUNTER — Telehealth: Payer: Self-pay | Admitting: *Deleted

## 2018-11-17 LAB — HIV ANTIBODY (ROUTINE TESTING W REFLEX): HIV Screen 4th Generation wRfx: NONREACTIVE

## 2018-11-17 LAB — SARS CORONAVIRUS 2 (TAT 6-24 HRS): SARS Coronavirus 2: NEGATIVE

## 2018-11-17 NOTE — Telephone Encounter (Signed)
Received call from patient.   States that he was seen in the ER for HA, but left AMA when they tried to admit him. States that he did have CT, but was advised he required MRI. Requested MD to order.   Reviewed chart with MD. MD advised patient will require OV to discuss. Call placed to patient. [place].

## 2018-11-18 NOTE — Telephone Encounter (Signed)
Patient returned call and made aware.   Appointment scheduled.   Of note, patient reports that when he was moved up tot he 3rd floor for overnight observation, they happened to put him in the same room his mother had been in when she was taken off life support. States that he could not stay in that room and his anxiety was very high, so he left AMA then.

## 2018-11-19 ENCOUNTER — Encounter: Payer: Self-pay | Admitting: Family Medicine

## 2018-11-19 ENCOUNTER — Other Ambulatory Visit: Payer: Self-pay

## 2018-11-19 ENCOUNTER — Ambulatory Visit (INDEPENDENT_AMBULATORY_CARE_PROVIDER_SITE_OTHER): Payer: BC Managed Care – PPO | Admitting: Family Medicine

## 2018-11-19 VITALS — BP 130/74 | HR 96 | Temp 99.1°F | Resp 14 | Ht 68.0 in | Wt 141.0 lb

## 2018-11-19 DIAGNOSIS — M5136 Other intervertebral disc degeneration, lumbar region: Secondary | ICD-10-CM

## 2018-11-19 DIAGNOSIS — G459 Transient cerebral ischemic attack, unspecified: Secondary | ICD-10-CM

## 2018-11-19 DIAGNOSIS — I779 Disorder of arteries and arterioles, unspecified: Secondary | ICD-10-CM | POA: Insufficient documentation

## 2018-11-19 DIAGNOSIS — I10 Essential (primary) hypertension: Secondary | ICD-10-CM | POA: Diagnosis not present

## 2018-11-19 DIAGNOSIS — M5442 Lumbago with sciatica, left side: Secondary | ICD-10-CM | POA: Diagnosis not present

## 2018-11-19 DIAGNOSIS — I6523 Occlusion and stenosis of bilateral carotid arteries: Secondary | ICD-10-CM | POA: Diagnosis not present

## 2018-11-19 DIAGNOSIS — R29898 Other symptoms and signs involving the musculoskeletal system: Secondary | ICD-10-CM

## 2018-11-19 DIAGNOSIS — M51369 Other intervertebral disc degeneration, lumbar region without mention of lumbar back pain or lower extremity pain: Secondary | ICD-10-CM | POA: Insufficient documentation

## 2018-11-19 DIAGNOSIS — G8929 Other chronic pain: Secondary | ICD-10-CM

## 2018-11-19 DIAGNOSIS — F1490 Cocaine use, unspecified, uncomplicated: Secondary | ICD-10-CM

## 2018-11-19 NOTE — Assessment & Plan Note (Signed)
Controlled no changes 

## 2018-11-19 NOTE — Progress Notes (Signed)
Subjective:    Patient ID: Raymond Simpson, male    DOB: [birth year], 48 y.o.   MRN: [id number]  Patient presents for Hosptial F/U (syncope- blacked out while cooking- bent over and passed out- tingling to L side of arm and neck- left AMA)   Pt here to f/u hospital visit. He was admitted to hospital after syncopal episode with subsequent left sided weakness in upper and lower ext, and facial numbness. He had severe headache which was concerning for dissection so CT angio was done in ER which showed atherosclersosis in carotids but no dissection.  His Labs were positive for Cocaine and marijuana which he admitted to using.  He denied any chest pain or shortness of breath.  Was maintained on his lisinopril.  When he was admitted to the floor they are planning to do MRI of his brain as his symptoms concerning for TIA/stroke however he began to have anxiety as he was placed in the same room his mother had been in before she died and he states that they would not move him so he left [place].  He is here today to complete his work-up.  He is still having some dizziness still has a little weakness in his left upper extremity but does feel like that is better his left lower extremity is back to baseline.  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  Of course he was not started on any anticoagulant or statin drug because he did not complete his work-up.  He states that his chronic pain is why he has been using cocaine at times.  He does get hydrocodone every now and then his last prescription was back in June.  He has known degenerative disc disease in his cervical and lumbar spine.  He was being followed by orthopedics.  He is on Lyrica for the radicular symptoms.  His last MRI of his lumbar spine was done in January of last year by orthopedics that showed disc and facet degeneration at L4-L5 with some foraminal stenosis.  Review Of Systems:  GEN- denies fatigue, fever, weight  loss,weakness, recent illness HEENT- denies eye drainage, change in vision, nasal discharge, CVS- denies chest pain, palpitations RESP- denies SOB, cough, wheeze ABD- denies N/V, change in stools, abd pain GU- denies dysuria, hematuria, dribbling, incontinence MSK- + joint pain, muscle aches, injury Neuro- denies headache, dizziness, syncope, seizure activity       Objective:    BP 130/74   Pulse 96   Temp 99.1 F (37.3 C) (Oral)   Resp 14   Ht 5\' 8"  (1.727 m)   Wt 141 lb (64 kg)   SpO2 100%   BMI 21.44 kg/m  GEN- NAD, alert and oriented x3 HEENT- PERRL, EOMI, non injected sclera, pink conjunctiva, MMM, oropharynx clear Neck- Supple, no thyromegaly, no bruit,  CVS- RRR, no murmur RESP-CTAB ABD-NABS,soft,NT,ND NEURO- Normal speech, CNII-XII in tact bilat , strength equal bilat UE, normal grasp, normal gait EXT- No edema Pulses- Radial, DP- 2+        Assessment & Plan:      Problem List Items Addressed This Visit      Unprioritized   Carotid artery disease (HCC)   Relevant Orders   MR Brain Wo Contrast   Chronic left-sided low back pain with left-sided sciatica    For his chronic back pain degenerative disc disease I am not can prescribe him any further narcotics.  He currently has cocaine in his system.  He  is agreeable to going to pain clinic.      DDD (degenerative disc disease), lumbar   Hypertension    Controlled no changes      TIA (transient ischemic attack) - Primary    Symptoms are concerning for TIA TIA/CVA event.  He has risk factors of known hypertension as well as the cocaine use.  He is not having any chest pain or shortness of breath.  Still has some dizziness and a little weakness in his upper extremity though this is not very noticeable.  As he does have the higher risk he does need an MRI.  We will obtain this soon as possible.  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  We will plan to put him on likely aspirin  and a statin drug.  I am checking his lipid panel today and repeating his metabolic panel.  Did discuss his drug use he states it is not all the time but advised him that this is not the proper way to deal with pain. Declines needed any rehab      Relevant Orders   MR Brain Wo Contrast   Comprehensive metabolic panel   Lipid panel    Other Visit Diagnoses    Left arm weakness       Relevant Orders   MR Brain Wo Contrast   Cocaine use          Note: This dictation was prepared with Dragon dictation along with smaller phrase technology. Any transcriptional errors that result from this process are unintentional.

## 2018-11-19 NOTE — Assessment & Plan Note (Signed)
For his chronic back pain degenerative disc disease I am not can prescribe him any further narcotics.  He currently has cocaine in his system.  He is agreeable to going to pain clinic.

## 2018-11-19 NOTE — Patient Instructions (Signed)
Referral to pain clinic We will call with lab results F/U 3 months for Physical

## 2018-11-19 NOTE — Assessment & Plan Note (Signed)
Symptoms are concerning for TIA TIA/CVA event.  He has risk factors of known hypertension as well as the cocaine use.  He is not having any chest pain or shortness of breath.  Still has some dizziness and a little weakness in his upper extremity though this is not very noticeable.  As he does have the higher risk he does need an MRI.  We will obtain this soon as possible.  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  We will plan to put him on likely aspirin and a statin drug.  I am checking his lipid panel today and repeating his metabolic panel.  Did discuss his drug use he states it is not all the time but advised him that this is not the proper way to deal with pain. Declines needed any rehab

## 2018-11-20 ENCOUNTER — Other Ambulatory Visit: Payer: Self-pay | Admitting: *Deleted

## 2018-11-20 ENCOUNTER — Other Ambulatory Visit: Payer: Self-pay | Admitting: Family Medicine

## 2018-11-20 DIAGNOSIS — G459 Transient cerebral ischemic attack, unspecified: Secondary | ICD-10-CM

## 2018-11-20 DIAGNOSIS — G8929 Other chronic pain: Secondary | ICD-10-CM

## 2018-11-20 DIAGNOSIS — I6523 Occlusion and stenosis of bilateral carotid arteries: Secondary | ICD-10-CM

## 2018-11-20 LAB — LIPID PANEL
Cholesterol: 198 mg/dL (ref <200)
HDL: 45 mg/dL (ref >=40)
LDL Cholesterol (Calc): 134 mg/dL (calc) — ABNORMAL HIGH
Non-HDL Cholesterol (Calc): 153 mg/dL (calc) — ABNORMAL HIGH (ref <130)
Total CHOL/HDL Ratio: 4.4 (calc) (ref <5.0)
Triglycerides: 89 mg/dL (ref <150)

## 2018-11-20 LAB — COMPREHENSIVE METABOLIC PANEL
AG Ratio: 1.7 (calc) (ref 1.0–2.5)
ALT: 10 U/L (ref 9–46)
AST: 16 U/L (ref 10–40)
Albumin: 4.5 g/dL (ref 3.6–5.1)
Alkaline phosphatase (APISO): 71 U/L (ref 36–130)
BUN: 12 mg/dL (ref 7–25)
CO2: 31 mmol/L (ref 20–32)
Calcium: 10 mg/dL (ref 8.6–10.3)
Chloride: 103 mmol/L (ref 98–110)
Creat: 1.27 mg/dL (ref 0.60–1.35)
Globulin: 2.7 g/dL (calc) (ref 1.9–3.7)
Glucose, Bld: 101 mg/dL — ABNORMAL HIGH (ref 65–99)
Potassium: 5.1 mmol/L (ref 3.5–5.3)
Sodium: 140 mmol/L (ref 135–146)
Total Bilirubin: 0.8 mg/dL (ref 0.2–1.2)
Total Protein: 7.2 g/dL (ref 6.1–8.1)

## 2018-11-20 LAB — EXTRA LAV TOP TUBE

## 2018-11-20 MED ORDER — ASPIRIN EC 81 MG PO TBEC: 81.0000 mg | DELAYED_RELEASE_TABLET | Freq: Every day | ORAL | Status: AC

## 2018-11-20 MED ORDER — ATORVASTATIN CALCIUM 20 MG PO TABS: 20.0000 mg | ORAL_TABLET | Freq: Every day | ORAL | 3 refills | Status: AC

## 2018-11-24 ENCOUNTER — Telehealth: Payer: Self-pay | Admitting: Family Medicine

## 2018-11-24 NOTE — Telephone Encounter (Signed)
Call placed to patient. LMTRC.  

## 2018-11-24 NOTE — Telephone Encounter (Signed)
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  CB# [phone]

## 2018-11-25 ENCOUNTER — Other Ambulatory Visit: Payer: Self-pay | Admitting: *Deleted

## 2018-11-25 DIAGNOSIS — G8929 Other chronic pain: Secondary | ICD-10-CM

## 2018-11-25 NOTE — Telephone Encounter (Signed)
Received call from patient.   Requested refill on Hydrocodone/APAP.   Ok to refill??  Last office visit 11/19/2018.  Last refill 09/15/2018.

## 2018-11-25 NOTE — Telephone Encounter (Signed)
Call placed to patient. LMTRC.  

## 2018-11-25 NOTE — Telephone Encounter (Signed)
Call placed to patient.   Reports that he misplaced note, but did find it and is at work today.

## 2018-11-26 NOTE — Telephone Encounter (Signed)
Per MD, no further refills will be given d/t positive test for cocaine while in ER. Was advised that for DDD, he could go to pain clinic if he prefers.   Call placed to patient. [place].

## 2018-11-26 NOTE — Telephone Encounter (Signed)
Patient returned call and made aware.

## 2018-11-28 ENCOUNTER — Telehealth: Payer: Self-pay | Admitting: Family Medicine

## 2018-11-28 NOTE — Telephone Encounter (Signed)
-----   Message from Launa Grill, LPN sent at 2/44/6286 10:15 AM EDT ----- I called [employer] to check the status of the CT head. CT was denied for the same reason as MRI. Due to patient being under the influence of cocaine at the time of initial incident they can not approve of the CT. With members plan, if patient is under the influence then imaging is excluded.

## 2018-11-28 NOTE — Telephone Encounter (Signed)
Please call pt and let him know that insurance denied both MRI and CT scan because he had cocaine in his system, this is a plan exclusion. If he gets recurrent weak spells like before or stroke like symptoms needs to go back to ER where they can do scan because it is an emergency situation

## 2018-11-28 NOTE — Telephone Encounter (Signed)
Spoke with patient and informed him that insurance denied both CT and MRI. Advised warning signs on when he needs to return to the ER. Patient verbalized understanding.

## 2019-02-13 ENCOUNTER — Other Ambulatory Visit: Payer: Self-pay | Admitting: *Deleted

## 2019-02-13 DIAGNOSIS — I10 Essential (primary) hypertension: Secondary | ICD-10-CM

## 2019-02-13 MED ORDER — LISINOPRIL 10 MG PO TABS: 10.0000 mg | ORAL_TABLET | Freq: Every day | ORAL | 1 refills | Status: AC

## 2019-03-03 ENCOUNTER — Encounter: Payer: BC Managed Care – PPO | Admitting: Family Medicine

## 2019-03-25 ENCOUNTER — Encounter: Payer: Self-pay | Admitting: Family Medicine

## 2019-04-27 ENCOUNTER — Encounter: Payer: Self-pay | Admitting: Family Medicine

## 2021-07-11 IMAGING — CT CT ANGIOGRAPHY HEAD
1 of 10 series · 6 of 33 positions shown · IV contrast (omnipaque)
Comparison: CT head 07/20/2010

CLINICAL DATA: Sudden headache.  Rule out dissection.

EXAM:
CT ANGIOGRAPHY HEAD AND NECK
TECHNIQUE: Multidetector CT imaging of the head and neck was performed using
the standard protocol during bolus administration of intravenous
contrast. Multiplanar CT image reconstructions and MIPs were
obtained to evaluate the vascular anatomy. Carotid stenosis
measurements (when applicable) are obtained utilizing NASCET
criteria, using the distal internal carotid diameter as the
denominator.
CONTRAST:  75mL OMNIPAQUE IOHEXOL 350 MG/ML SOLN

[Series 10: ax thins · axial · 0.47mm/px · z∈[-149,+130]mm · 6 of 391 slices shown]
[im 56/391  soft-tissue]
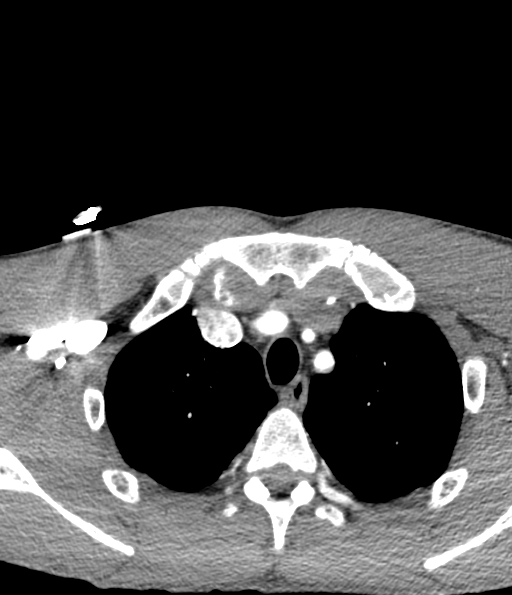
[im 112/391  bone]
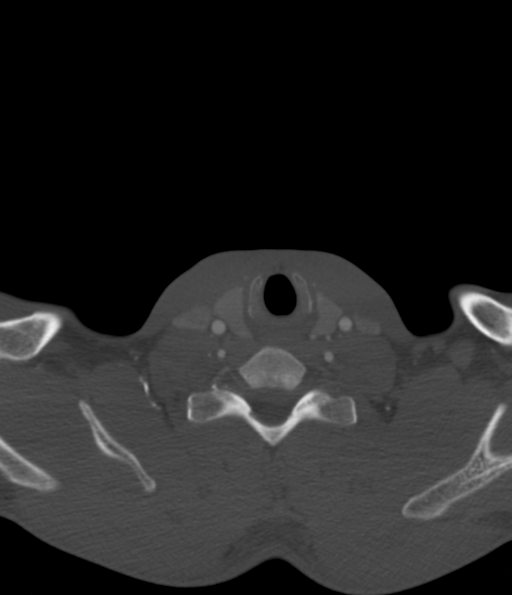
[im 168/391  soft-tissue]
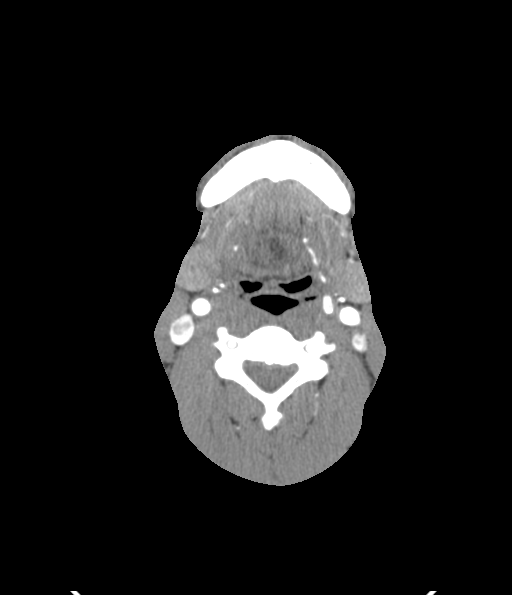
[im 223/391  bone]
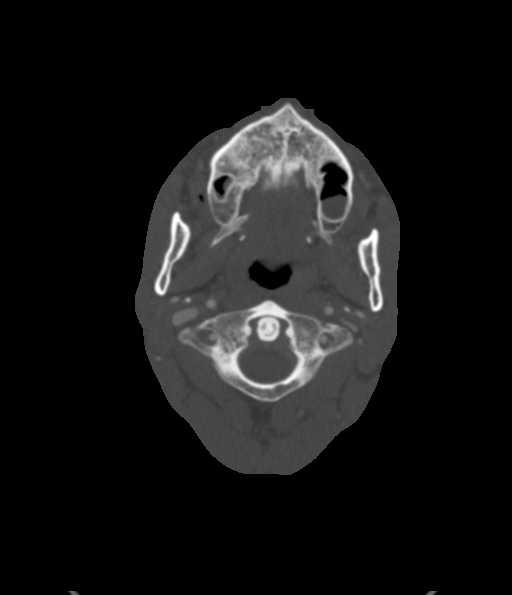
[im 279/391  soft-tissue]
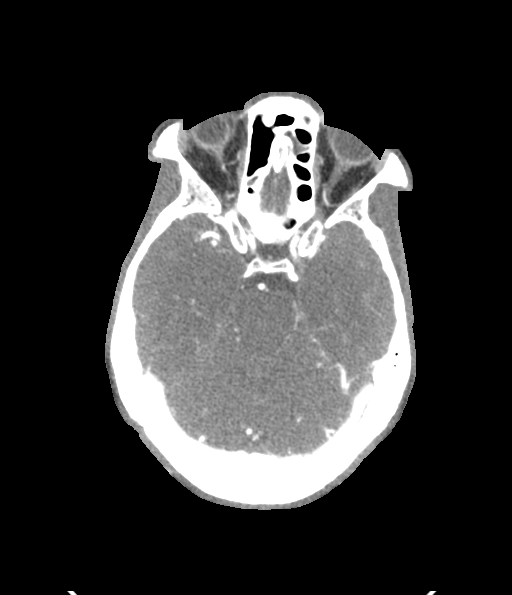
[im 335/391  bone]
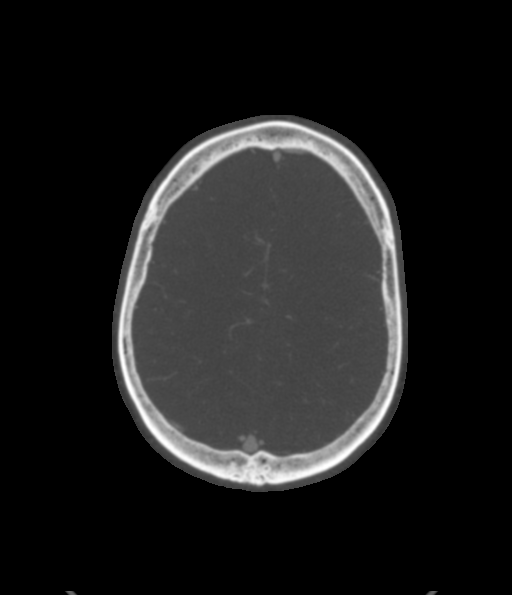

[6 of 33 positions shown; findings below may reference images not displayed]

FINDINGS: CT HEAD FINDINGS

Brain: No evidence of acute infarction, hemorrhage, hydrocephalus,
extra-axial collection or mass lesion/mass effect.

Vascular: Negative for hyperdense vessel

Skull: Negative

Sinuses: Negative

Orbits: Negative

Review of the MIP images confirms the above findings

CTA NECK FINDINGS

Aortic arch: Standard branching. Imaged portion shows no evidence of
aneurysm or dissection. No significant stenosis of the major arch
vessel origins.

Right carotid system: Mild atherosclerotic disease right carotid
bifurcation without significant stenosis. Negative for dissection

Left carotid system: Mild atherosclerotic disease left carotid
bifurcation without significant stenosis. Negative for dissection.

Vertebral arteries: Both vertebral arteries widely patent
bilaterally without stenosis or dissection

Skeleton: No acute abnormality.

Other neck: Negative for mass or adenopathy.

Upper chest: Mild apically emphysema bilaterally.

Review of the MIP images confirms the above findings

CTA HEAD FINDINGS

Anterior circulation: Mild atherosclerotic disease in the cavernous
carotid bilaterally without stenosis or aneurysm. Anterior and
middle cerebral arteries widely patent bilaterally.

Posterior circulation: Both vertebral arteries patent to the
basilar. PICA patent bilaterally. Basilar widely patent. Superior
cerebellar and posterior cerebral arteries patent bilaterally
without stenosis. Negative for aneurysm

Venous sinuses: Normal venous enhancement.

Anatomic variants: None

Review of the MIP images confirms the above findings
IMPRESSION: 1. Mild atherosclerotic disease in the carotid bifurcation. No
significant carotid or vertebral artery stenosis or dissection.
2. No significant intracranial stenosis. Negative for large vessel
occlusion
3. Negative CT head
